# Patient Record
Sex: Female | Born: 1950 | Race: Black or African American | Hispanic: No | Marital: Married
Health system: Midwestern US, Academic
[De-identification: ages and names within clinical notes are randomized; demographics above are authoritative.]

---

## 2016-05-29 MED ORDER — LOSARTAN 100 MG PO TAB
100 mg | ORAL_TABLET | Freq: Every day | ORAL | 3 refills | 30.00000 days | Status: DC
Start: 2016-05-29 — End: 2016-08-30

## 2016-06-04 MED ORDER — SPIRONOLACTONE 25 MG PO TAB
ORAL_TABLET | Freq: Every day | ORAL | 3 refills | 90.00000 days | Status: DC
Start: 2016-06-04 — End: 2016-08-30

## 2016-06-29 MED ORDER — CARVEDILOL 25 MG PO TAB
ORAL_TABLET | Freq: Two times a day (BID) | ORAL | 3 refills | 90.00000 days | Status: DC
Start: 2016-06-29 — End: 2016-08-30

## 2016-07-06 MED ORDER — LOSARTAN 50 MG PO TAB
ORAL_TABLET | Freq: Every day | ORAL | 3 refills | 90.00000 days | Status: DC
Start: 2016-07-06 — End: 2016-08-30

## 2016-08-30 ENCOUNTER — Ambulatory Visit: Admit: 2016-08-30 | Discharge: 2016-08-31 | Payer: MEDICARE

## 2016-08-30 ENCOUNTER — Encounter: Admit: 2016-08-30 | Discharge: 2016-08-30 | Payer: MEDICARE

## 2016-08-30 DIAGNOSIS — E7800 Pure hypercholesterolemia, unspecified: Principal | ICD-10-CM

## 2016-08-30 DIAGNOSIS — Z95810 Presence of automatic (implantable) cardiac defibrillator: ICD-10-CM

## 2016-08-30 DIAGNOSIS — I10 Essential (primary) hypertension: ICD-10-CM

## 2016-08-30 DIAGNOSIS — I428 Other cardiomyopathies: ICD-10-CM

## 2016-08-30 MED ORDER — SPIRONOLACTONE 25 MG PO TAB
25 mg | ORAL_TABLET | Freq: Every day | ORAL | 3 refills | 90.00000 days | Status: AC
Start: 2016-08-30 — End: 2017-02-05

## 2016-08-30 MED ORDER — LOSARTAN 100 MG PO TAB
100 mg | ORAL_TABLET | Freq: Every day | ORAL | 3 refills | 30.00000 days | Status: AC
Start: 2016-08-30 — End: 2017-05-07

## 2016-08-30 MED ORDER — CARVEDILOL 25 MG PO TAB
37.5 mg | ORAL_TABLET | Freq: Two times a day (BID) | ORAL | 3 refills | 90.00000 days | Status: AC
Start: 2016-08-30 — End: 2017-05-07

## 2016-08-30 NOTE — Progress Notes
Patient in clinic for device check and informs nursing her med list is wrong. She is only taking losartan 100 mg daily.  Losartan 50 mg was removed from her med list and refills submitted per patient request.

## 2016-09-11 ENCOUNTER — Ambulatory Visit: Admit: 2016-09-11 | Discharge: 2016-09-12 | Payer: MEDICARE

## 2016-09-11 ENCOUNTER — Encounter: Admit: 2016-09-11 | Discharge: 2016-09-11 | Payer: MEDICARE

## 2016-09-11 DIAGNOSIS — I10 Essential (primary) hypertension: ICD-10-CM

## 2016-09-11 DIAGNOSIS — E785 Hyperlipidemia, unspecified: ICD-10-CM

## 2016-09-11 DIAGNOSIS — I472 Ventricular tachycardia: ICD-10-CM

## 2016-09-11 DIAGNOSIS — G500 Trigeminal neuralgia: ICD-10-CM

## 2016-09-11 DIAGNOSIS — Z95810 Presence of automatic (implantable) cardiac defibrillator: ICD-10-CM

## 2016-09-11 DIAGNOSIS — M609 Myositis, unspecified: ICD-10-CM

## 2016-09-11 DIAGNOSIS — I509 Heart failure, unspecified: ICD-10-CM

## 2016-09-11 DIAGNOSIS — E041 Nontoxic single thyroid nodule: ICD-10-CM

## 2016-09-11 DIAGNOSIS — I428 Other cardiomyopathies: ICD-10-CM

## 2016-09-11 NOTE — Progress Notes
Date of Service: 09/11/2016    Stephanie Morse is a 66 y.o. female.       HPI     Stephanie Morse presents for evaluation of her heart failure 6 months after I last saw her.  She has just spent 2 weeks hosting her 77 and 44 year old grandchildren who live in [place].  She played the role of high-energy grandmother getting them to world to find the zoo museums and she held up fairly well.  She is not had any new shortness of breath or swelling.  She feels fatigue it is looking forward to just resting a bit now but she got through the entire 2 weeks without having to cut back because of her health condition.  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  Her EF improved to around 40% with CRT-D and medical therapy.  She had an ICD shock in 2012 because of one-to-one conduction in a supraventricular tachycardia but does not have track record of appropriate shocks in in recent memory.    She is on good doses of spironolactone carvedilol and losartan.    When I saw her last I was concerned about her blood pressure and double her losartan from 50-100.  Her blood pressure today is much improved with readings of 104 and 108 in the right and left arms respectively.  She has had no hypotensive side effects on the higher dose losartan.    Her last echo was in October 2016 with EF 35% LV end-diastolic dimension upper range of normal at 5.5.  She has had no change in her clinical status since then and does not require repeat echo.    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  She takes low-dose Aleve or IBU trying to control her arthritis with the understanding that the less she takes the easier it is on her heart given the risks of renal insufficiency and hypertension with worsening heart failure due to NSAIDs.       Vitals:    09/11/16 1422 09/11/16 1433   BP: 108/70 104/68   Pulse:  81   Weight: 86.6 kg (191 lb) Height: 1.676 m (5' 6)      Body mass index is 30.83 kg/m???.     Past Medical History  Patient Active Problem List    Diagnosis Date Noted   ??? 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 02/26/05 CRT-D implant Dr. Bradly Bienenstock d/t Non sust'd VT w/ acute CHF . Fidelis; LV [id number] Lead, EF and QOL improve.  b. 02/13/08: VT treated w/ burst pacing x1, resolved w/ Rx, Pt had sx tachypalpitations  C 01/20/10  CRT-D Generator change, Fidelis  Lead removed,  LV lead repair at angulation  point near generator, DFT testing: VT terminates w 20 J shock but not 15.(LDB)  D. 12/24/14 CRT-D Generator change. Medtronic CRT-D VIVA XT CRTD [id number]     ??? Cough due to ACE inhibitor 11/30/2014     11/1114 reports dry cough after taking morning meds, Lisinopril 20 DC'd losartan 50 begun     ??? 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 Atrial tachycardia [facility]) 04/26/2010     Feb 2012 ICD shock in FVT zone for 1:1  conduction  03/2310 Mag slighly low at 1.7, 400 mg supplement begun but triggers nausea.      ??? VT (ventricular tachycardia) (HCC) 01/03/2010   ??? Thyroid nodule 10/27/2008     benign     ??? Trigeminal neuralgia 10/27/2008   ??? 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  11/06 DOE, orthop, PND onset w/ progression.   b.  02/20/05 admit [facility] acute CHF, non sust'd VT, LBBB BNP 931>>02/21/05 Transfer [facility], Echo EF 15%, LV 7.6, LA 4.9, Mod MR.   c.  1/07 [facility]: Normal coronaries, EF 20%, Coreg, ACE titration, 02/26/05 Implant Medtronic CRT-D.   d.  07/09/05 Echo EF 25-30% LV 6.2  e.  12/07 Echo EF 40%, LVEDD 5.2 PAP <30  f.   4/08 EF 45% LV 5.5  G. 2/10 Echo EF 45%, Valves OK mild MR  H. 08/18/09 Regaden Thall EF 51%, LV volume 110, Normal perfusion.   I   12/08/12-2D echo- decreased LV contractility from 04/06/2008 04/2013: Echo Doppler: LV EF 40% PAP= 43 Mild to mod Concentric LVH     ??? 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  Positive anti-nuclear antibody,RNP     ??? Hypertension 07/03/2008     a. 2004 Rx Altace, HCTZ Dr. Reather Littler     ??? Hyperlipemia 07/03/2008     07/23/11 total 129, trig 57, HDL 39 LDL 79 on simvastatin 20           Review of Systems   Constitution: Positive for malaise/fatigue.   HENT: Negative.    Eyes: Negative.    Cardiovascular: Positive for claudication.   Respiratory: Negative.    Endocrine: Negative.    Hematologic/Lymphatic: Negative.    Skin: Negative.    Musculoskeletal: Positive for arthritis, joint swelling and stiffness.   Gastrointestinal: Positive for heartburn.   Genitourinary: Negative.    Neurological: Positive for excessive daytime sleepiness.   Psychiatric/Behavioral: Negative.    Allergic/Immunologic: Negative.    14 organ system review noted. It is negative except as reported in current narrative or above in the ROS section. This is a patient centered review of systems that was stated by the patient in her terms prior to my personal problem oriented interview with the patient     Physical Exam  Appearance:Looks generally healthy and comfortable, no distress,  mucous membranes moist.   Skin: Skin warm and dry Eyes: Pupils equal and round Thyroid: not enlarged   Carotids: Normal upstrokes, no bruits Neck Veins: CVP less than 6 cm, no V wave, no HJ Reflux   Chest/thorax: Breathing comfortably. Lungs clear to percussion and auscultation. No rales, rhonchi or wheezing. Left delto-pectoral electronic pulse generator  Cardiac: Rhythm regular. PMI not palpable. S1 and S2 normal with fourth heart sound but no rub or third sound. No murmur   Abdominal Exam: Abdomen soft, non-tender, no masses. Normal bowel sounds. Liver not enlarged. No abdominal bruit, aorta not palpable Pulses: Femorals: Normal pulses, no bruits Pedals Normal PT pulses   Leg/ankle edema: None. Neuro/Motor: Normal muscle strength and control.   Neuro/Cognition: Good insight, clear historian, no depression     Cardiovascular Studies  Today's 12 lead EKG: P synchronous ventricular paced rhythm, sinus rate 81.     Her cholesterol profile last October looked quite good on low-dose simvastatin.  No reason to change.  Annual recheck later this fall is reasonable.  Ref. Range 11/21/2015    Cholesterol L 150 - 200  123 (L)   Triglycerides Unknown 53   HDL  35 - 60  33 (L)   LDL  81     Problems Addressed Today  Encounter Diagnoses   Name Primary?   ??? VT (ventricular tachycardia) (HCC) Yes   ??? Biventricular implantable cardioverter-defibrillator in situ    ??? Other cardiomyopathy (HCC)        Assessment and Plan     Mrs. Dian Situ is doing extremely well.  Her blood pressure control looks better on the higher dose losartan.  She has no fluid overload findings.  I am really not sure she has any symptoms of heart failure at this time since her activities of daily living are normal or higher than average for her age.  Medical program is on target.  There is no reason to consider moving her from losartan to [facility] given her lack of symptoms.  She knows to call if she develops any worsening symptoms of heart failure or fluid overload.  I think she can schedule her next visit in a year since were since she seems stable and not changed any meds today.  She had an excellent result with the losartan at 100 and does not need further augmentation of medical therapy for blood pressure.    NB: This document was prepared within the Epic(TM) EMR using templates developed by the [facility] of [facility]. It can be accessed online through [facility] feature by clinicians at other Epic institutions.  The free text in this document, was generated through Dragon(TM) software.  Editing and proofreading were done by the author of this document Dr. Mable Paris MD, [facility] principally at the point of care.  In spite of the author's best effort to identify every error introduced by voice to text dictation, some errors that may represent misspelling or misstatements of what was dictated may persist.  If there are questions about content in this document please contact Dr. Hale Bogus.    The written information I provided Ms. Hutmacher at the conclusion of today's encounter is as  follows:      Patient Instructions   He is referred for my dictation I think you are doing great I do not think you have any symptoms of heart failure her blood pressure is much better on 100 mg losartan that was on 50.  I am really thrilled to see you living an active life with no symptoms of heart failure.  Keep up the good work.  I was want people to be physically active 3 hours a week with some form of activity good whether or bad.  Having her 2 grandchildren at home accounts for the 3 hours but absent them he may need to add some walking.  I will see you again in a year he will need any tests at that time.  If you develop any problems with breathing or swelling before then please let me know and I will see you early.    I think would be prudent to check your BMP blood test for chemistry now that you are on the higher dose of losartan just to be certain that nothing changed for the worse.    It's good to see you today.  Marissa Nestle, MD              Current Medications (including today's revisions)  ??? carvedilol (COREG) 25 mg tablet Take 1.5 tablets by  mouth twice daily. Take with food.   ??? hydroxychloroquine (PLAQUENIL) 200 mg tablet Take 200 mg by mouth daily. 2  tabs daily   ??? losartan(+) (COZAAR) 100 mg tablet Take 1 tablet by mouth daily.   ??? simvastatin (ZOCOR) 20 mg PO tablet Take 20 mg by mouth at bedtime daily.   ??? spironolactone (ALDACTONE) 25 mg tablet Take 1 tablet by mouth daily. Take with food.

## 2016-09-15 ENCOUNTER — Encounter: Admit: 2016-09-15 | Discharge: 2016-09-15 | Payer: MEDICARE

## 2016-09-15 DIAGNOSIS — E041 Nontoxic single thyroid nodule: ICD-10-CM

## 2016-09-15 DIAGNOSIS — Z95810 Presence of automatic (implantable) cardiac defibrillator: ICD-10-CM

## 2016-09-15 DIAGNOSIS — I10 Essential (primary) hypertension: ICD-10-CM

## 2016-09-15 DIAGNOSIS — G500 Trigeminal neuralgia: ICD-10-CM

## 2016-09-15 DIAGNOSIS — I509 Heart failure, unspecified: ICD-10-CM

## 2016-09-15 DIAGNOSIS — I472 Ventricular tachycardia: ICD-10-CM

## 2016-09-15 DIAGNOSIS — M609 Myositis, unspecified: ICD-10-CM

## 2016-09-15 DIAGNOSIS — E785 Hyperlipidemia, unspecified: ICD-10-CM

## 2016-09-17 ENCOUNTER — Encounter: Admit: 2016-09-17 | Discharge: 2016-09-17 | Payer: MEDICARE

## 2016-09-17 DIAGNOSIS — Z95810 Presence of automatic (implantable) cardiac defibrillator: ICD-10-CM

## 2016-09-17 DIAGNOSIS — I428 Other cardiomyopathies: ICD-10-CM

## 2016-09-17 DIAGNOSIS — I472 Ventricular tachycardia: Principal | ICD-10-CM

## 2016-09-17 LAB — BASIC METABOLIC PANEL
Lab: 0.8
Lab: 107
Lab: 14
Lab: 4.2
Lab: 9.2
Lab: 112 — ABNORMAL HIGH (ref 98–107)
Lab: 142 10*3/uL (ref 0–0.20)
Lab: 22 — ABNORMAL LOW (ref 23–31)

## 2016-10-01 ENCOUNTER — Ambulatory Visit: Admit: 2016-10-01 | Discharge: 2016-10-02 | Payer: MEDICARE

## 2016-10-02 DIAGNOSIS — I472 Ventricular tachycardia: Principal | ICD-10-CM

## 2016-10-02 DIAGNOSIS — E7800 Pure hypercholesterolemia, unspecified: ICD-10-CM

## 2016-10-02 DIAGNOSIS — I428 Other cardiomyopathies: ICD-10-CM

## 2016-12-11 ENCOUNTER — Encounter: Admit: 2016-12-11 | Discharge: 2016-12-11 | Payer: MEDICARE

## 2016-12-11 NOTE — Telephone Encounter
Pt called to report that she is feeling fine and her BP was 93/65 on 12/10/16 at [facility]. Pt states she feels fine and she had an app on 10/22/187 with PCP who told her BP was great. Pt was advised to call if has symptoms.

## 2016-12-31 ENCOUNTER — Ambulatory Visit: Admit: 2016-12-31 | Discharge: 2017-01-01 | Payer: MEDICARE

## 2016-12-31 DIAGNOSIS — I472 Ventricular tachycardia: Principal | ICD-10-CM

## 2017-01-07 ENCOUNTER — Encounter: Admit: 2017-01-07 | Discharge: 2017-01-07 | Payer: MEDICARE

## 2017-01-07 DIAGNOSIS — I428 Other cardiomyopathies: ICD-10-CM

## 2017-01-07 DIAGNOSIS — Z95810 Presence of automatic (implantable) cardiac defibrillator: Principal | ICD-10-CM

## 2017-01-07 DIAGNOSIS — I472 Ventricular tachycardia: ICD-10-CM

## 2017-02-05 ENCOUNTER — Encounter: Admit: 2017-02-05 | Discharge: 2017-02-05 | Payer: MEDICARE

## 2017-02-05 MED ORDER — SPIRONOLACTONE 25 MG PO TAB
25 mg | ORAL_TABLET | Freq: Every day | ORAL | 1 refills | 90.00000 days | Status: AC
Start: 2017-02-05 — End: 2018-02-25

## 2017-04-01 ENCOUNTER — Ambulatory Visit: Admit: 2017-04-01 | Discharge: 2017-04-02 | Payer: MEDICARE

## 2017-04-02 DIAGNOSIS — I472 Ventricular tachycardia: Principal | ICD-10-CM

## 2017-05-07 ENCOUNTER — Encounter: Admit: 2017-05-07 | Discharge: 2017-05-07 | Payer: MEDICARE

## 2017-05-07 DIAGNOSIS — I429 Cardiomyopathy, unspecified: Principal | ICD-10-CM

## 2017-05-07 DIAGNOSIS — I10 Essential (primary) hypertension: ICD-10-CM

## 2017-05-07 DIAGNOSIS — E785 Hyperlipidemia, unspecified: ICD-10-CM

## 2017-05-07 MED ORDER — LOSARTAN 100 MG PO TAB
100 mg | ORAL_TABLET | Freq: Every day | ORAL | 3 refills | 30.00000 days | Status: AC
Start: 2017-05-07 — End: 2018-04-14

## 2017-05-07 MED ORDER — CARVEDILOL 25 MG PO TAB
37.5 mg | ORAL_TABLET | Freq: Two times a day (BID) | ORAL | 3 refills | 90.00000 days | Status: AC
Start: 2017-05-07 — End: 2018-04-14

## 2017-05-07 MED ORDER — SIMVASTATIN 20 MG PO TAB
20 mg | ORAL_TABLET | Freq: Every evening | ORAL | 0 refills | Status: AC
Start: 2017-05-07 — End: ?

## 2017-05-14 LAB — LIPID PROFILE
Lab: 107 mg/dL — ABNORMAL LOW (ref 150–200)
Lab: 3
Lab: 44 mg/dL (ref 8.5–10.6)
Lab: 61 mL/min (ref >60)

## 2017-05-14 LAB — BASIC METABOLIC PANEL
Lab: 141
Lab: 22 — ABNORMAL LOW (ref 23–31)

## 2017-05-14 LAB — BNP (B-TYPE NATRIURETIC PEPTI): Lab: <10

## 2017-05-23 ENCOUNTER — Encounter: Admit: 2017-05-23 | Discharge: 2017-05-23 | Payer: MEDICARE

## 2017-05-23 DIAGNOSIS — I10 Essential (primary) hypertension: ICD-10-CM

## 2017-05-23 DIAGNOSIS — I472 Ventricular tachycardia: ICD-10-CM

## 2017-05-23 DIAGNOSIS — Z95810 Presence of automatic (implantable) cardiac defibrillator: ICD-10-CM

## 2017-05-23 DIAGNOSIS — E785 Hyperlipidemia, unspecified: ICD-10-CM

## 2017-05-23 DIAGNOSIS — M609 Myositis, unspecified: ICD-10-CM

## 2017-05-23 DIAGNOSIS — E041 Nontoxic single thyroid nodule: ICD-10-CM

## 2017-05-23 DIAGNOSIS — I509 Heart failure, unspecified: ICD-10-CM

## 2017-05-23 DIAGNOSIS — G500 Trigeminal neuralgia: ICD-10-CM

## 2017-07-01 ENCOUNTER — Ambulatory Visit: Admit: 2017-07-01 | Discharge: 2017-07-02 | Payer: MEDICARE

## 2017-07-02 DIAGNOSIS — I472 Ventricular tachycardia: Principal | ICD-10-CM

## 2017-07-02 DIAGNOSIS — I428 Other cardiomyopathies: Secondary | ICD-10-CM

## 2017-08-13 ENCOUNTER — Encounter: Admit: 2017-08-13 | Discharge: 2017-08-13 | Payer: MEDICARE

## 2017-08-13 DIAGNOSIS — E785 Hyperlipidemia, unspecified: Principal | ICD-10-CM

## 2017-08-27 ENCOUNTER — Ambulatory Visit: Admit: 2017-08-27 | Discharge: 2017-08-28 | Payer: MEDICARE

## 2017-08-27 ENCOUNTER — Encounter: Admit: 2017-08-27 | Discharge: 2017-08-27 | Payer: MEDICARE

## 2017-08-27 DIAGNOSIS — E041 Nontoxic single thyroid nodule: ICD-10-CM

## 2017-08-27 DIAGNOSIS — Z95810 Presence of automatic (implantable) cardiac defibrillator: Principal | ICD-10-CM

## 2017-08-27 DIAGNOSIS — I10 Essential (primary) hypertension: ICD-10-CM

## 2017-08-27 DIAGNOSIS — I509 Heart failure, unspecified: ICD-10-CM

## 2017-08-27 DIAGNOSIS — M609 Myositis, unspecified: ICD-10-CM

## 2017-08-27 DIAGNOSIS — E785 Hyperlipidemia, unspecified: ICD-10-CM

## 2017-08-27 DIAGNOSIS — I420 Dilated cardiomyopathy: ICD-10-CM

## 2017-08-27 DIAGNOSIS — I472 Ventricular tachycardia: ICD-10-CM

## 2017-08-27 DIAGNOSIS — G500 Trigeminal neuralgia: ICD-10-CM

## 2017-09-30 ENCOUNTER — Ambulatory Visit: Admit: 2017-09-30 | Discharge: 2017-10-01 | Payer: MEDICARE

## 2017-10-01 DIAGNOSIS — I428 Other cardiomyopathies: ICD-10-CM

## 2017-10-01 DIAGNOSIS — I472 Ventricular tachycardia: Principal | ICD-10-CM

## 2017-12-30 ENCOUNTER — Ambulatory Visit: Admit: 2017-12-30 | Discharge: 2017-12-31 | Payer: MEDICARE

## 2017-12-31 DIAGNOSIS — I472 Ventricular tachycardia: Principal | ICD-10-CM

## 2018-02-25 ENCOUNTER — Encounter: Admit: 2018-02-25 | Discharge: 2018-02-25 | Payer: MEDICARE

## 2018-02-25 MED ORDER — SPIRONOLACTONE 25 MG PO TAB
25 mg | ORAL_TABLET | Freq: Every day | ORAL | 1 refills | 46.00000 days | Status: AC
Start: 2018-02-25 — End: 2018-02-27

## 2018-02-27 ENCOUNTER — Encounter: Admit: 2018-02-27 | Discharge: 2018-02-27 | Payer: MEDICARE

## 2018-02-27 MED ORDER — SPIRONOLACTONE 25 MG PO TAB
25 mg | ORAL_TABLET | Freq: Every day | ORAL | 1 refills | 46.00000 days | Status: AC
Start: 2018-02-27 — End: ?

## 2018-02-27 MED ORDER — SPIRONOLACTONE 25 MG PO TAB
25 mg | ORAL_TABLET | Freq: Every day | ORAL | 1 refills | 90.00000 days | Status: AC
Start: 2018-02-27 — End: 2018-08-13

## 2018-03-31 ENCOUNTER — Ambulatory Visit: Admit: 2018-03-31 | Discharge: 2018-04-01 | Payer: MEDICARE

## 2018-04-01 DIAGNOSIS — I472 Ventricular tachycardia: Principal | ICD-10-CM

## 2018-04-14 ENCOUNTER — Encounter: Admit: 2018-04-14 | Discharge: 2018-04-14 | Payer: MEDICARE

## 2018-04-14 MED ORDER — LOSARTAN 100 MG PO TAB
ORAL_TABLET | Freq: Every day | ORAL | 3 refills | 30.00000 days | Status: AC
Start: 2018-04-14 — End: 2018-09-25

## 2018-04-14 MED ORDER — CARVEDILOL 25 MG PO TAB
37.5 mg | ORAL_TABLET | Freq: Two times a day (BID) | ORAL | 3 refills | 90.00000 days | Status: AC
Start: 2018-04-14 — End: 2019-02-25

## 2018-06-30 ENCOUNTER — Encounter: Admit: 2018-06-30 | Discharge: 2018-06-30 | Payer: MEDICARE

## 2018-06-30 ENCOUNTER — Ambulatory Visit: Admit: 2018-06-30 | Discharge: 2018-06-30 | Payer: MEDICARE

## 2018-06-30 DIAGNOSIS — I428 Other cardiomyopathies: Principal | ICD-10-CM

## 2018-06-30 DIAGNOSIS — Z95810 Presence of automatic (implantable) cardiac defibrillator: ICD-10-CM

## 2018-06-30 DIAGNOSIS — I420 Dilated cardiomyopathy: Principal | ICD-10-CM

## 2018-06-30 DIAGNOSIS — I472 Ventricular tachycardia: ICD-10-CM

## 2018-06-30 DIAGNOSIS — I429 Cardiomyopathy, unspecified: ICD-10-CM

## 2018-08-13 ENCOUNTER — Encounter: Admit: 2018-08-13 | Discharge: 2018-08-13

## 2018-08-13 MED ORDER — SPIRONOLACTONE 25 MG PO TAB
25 mg | ORAL_TABLET | Freq: Every day | ORAL | 0 refills | 90.00000 days | Status: DC
Start: 2018-08-13 — End: 2018-09-25

## 2018-09-25 ENCOUNTER — Encounter: Admit: 2018-09-25 | Discharge: 2018-09-25

## 2018-09-25 NOTE — Progress Notes
Please send the following records for continuity of care:     Stephanie Morse  DOB  1951/01/06    Most recent lipid and liver profile, chemistry panel, and thyroid panel.     Please fax results to:     [facility] Cardiovascular Medicine Department     Dionicia Abler / [place] office fax: [phone]          Thank you,         Please call [phone] with any questions or concerns.

## 2018-09-25 NOTE — Telephone Encounter
09/25/2018 3:35 PM   I received a VM from [facility]. She saw her PCP Dr Shon Hough and he made some medication changes and drew some lab work.  I updated the med list and will send for labs. I called Kitrina back and let her know I had sent for her lab and updated her medication list.Laqueshia Cihlar Roslyn Smiling, Therapist, sports

## 2018-09-29 ENCOUNTER — Encounter: Admit: 2018-09-29 | Discharge: 2018-09-29

## 2018-09-29 ENCOUNTER — Ambulatory Visit: Admit: 2018-09-29 | Discharge: 2018-09-30

## 2018-09-29 DIAGNOSIS — I420 Dilated cardiomyopathy: Secondary | ICD-10-CM

## 2018-09-29 DIAGNOSIS — I472 Ventricular tachycardia: Secondary | ICD-10-CM

## 2018-09-29 DIAGNOSIS — Z95810 Presence of automatic (implantable) cardiac defibrillator: Secondary | ICD-10-CM

## 2018-09-29 DIAGNOSIS — I429 Cardiomyopathy, unspecified: Secondary | ICD-10-CM

## 2018-10-14 ENCOUNTER — Encounter: Admit: 2018-10-14 | Discharge: 2018-10-14

## 2018-10-14 ENCOUNTER — Ambulatory Visit: Admit: 2018-10-14 | Discharge: 2018-10-15

## 2018-10-14 DIAGNOSIS — G500 Trigeminal neuralgia: Secondary | ICD-10-CM

## 2018-10-14 DIAGNOSIS — E785 Hyperlipidemia, unspecified: Secondary | ICD-10-CM

## 2018-10-14 DIAGNOSIS — I420 Dilated cardiomyopathy: Secondary | ICD-10-CM

## 2018-10-14 DIAGNOSIS — M359 Systemic involvement of connective tissue, unspecified: Secondary | ICD-10-CM

## 2018-10-14 DIAGNOSIS — I10 Essential (primary) hypertension: Secondary | ICD-10-CM

## 2018-10-14 DIAGNOSIS — I472 Ventricular tachycardia: Secondary | ICD-10-CM

## 2018-10-14 DIAGNOSIS — M609 Myositis, unspecified: Secondary | ICD-10-CM

## 2018-10-14 DIAGNOSIS — E041 Nontoxic single thyroid nodule: Secondary | ICD-10-CM

## 2018-10-14 DIAGNOSIS — I428 Other cardiomyopathies: Secondary | ICD-10-CM

## 2018-10-14 DIAGNOSIS — E7800 Pure hypercholesterolemia, unspecified: Secondary | ICD-10-CM

## 2018-10-14 DIAGNOSIS — Z95810 Presence of automatic (implantable) cardiac defibrillator: Secondary | ICD-10-CM

## 2018-10-14 DIAGNOSIS — I509 Heart failure, unspecified: Secondary | ICD-10-CM

## 2018-10-14 DIAGNOSIS — R05 Cough: Secondary | ICD-10-CM

## 2018-10-14 MED ORDER — HYDROXYCHLOROQUINE 200 MG PO TAB
200 mg | Freq: Every day | ORAL | 0 refills | 90.00000 days | Status: AC
Start: 2018-10-14 — End: ?

## 2018-10-14 MED ORDER — CANDESARTAN 4 MG PO TAB
4 mg | ORAL_TABLET | Freq: Every day | ORAL | 3 refills | Status: DC
Start: 2018-10-14 — End: 2019-03-27

## 2018-10-14 NOTE — Progress Notes
Date of Service: 10/14/2018    Stephanie Morse is a 68 y.o. female.       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  He has had significant LV dysfunction and had EF 20% with initial presentation at [facility] and November 2007 after heart failure symptoms in [place] an acute hospitalization in December 2006.  She has had good response to medical therapy..  She has had minimal symptoms for years.  Her EF's have been around 40% on the last several echoes with the most recent echo in 2016.  She has had no changes in her clinical status that warranted another echo as she already has a defibrillator in and other no treatment decisions to be made based solely on the echo.    She is not having any symptoms of heart failure.  She has no fluid overload symptoms.  She is fully retired as is her husband and she is enjoying activities of daily living with 2 grandchildren living in the area the other is in [place].    She reports multiple awakenings for urination at night.  She is not on a diuretic.  Her only medications for her heart are carvedilol 37.5 twice daily amlodipine for hypertension and simvastatin for cholesterol control in the absence of any clinically relevant coronary atherosclerotic events.      She has been taking hydroxychloroquine 400 mg daily for years with indication being mixed connective tissue disease.  With the recent controversies about the presidential an ointment of hydroxychloroquine as a treatment for COVID-19 I asked her about her therapy with hydroxychloroquine.  She has had some delays in shipping from [facility] mail order but no sustained periods of time without therapy.  She does not have a lot of arthritic the right now and is uncertain about needing to continue the 400 mg daily dose she has been on for years.  She has lost contact with the rheumatologist that prescribed the hydroxychloroquine initially.         Vitals: 10/14/18 1409 10/14/18 1417   BP: 116/72 118/74   BP Source: Arm, Left Upper Arm, Right Upper   Pulse: 87    Temp: 36.6 ???C (97.9 ???F)    SpO2: 97%    Weight: 79.8 kg (176 lb)    Height: 1.676 m (5' 6)    PainSc: Zero      Body mass index is 28.41 kg/m???.     Past Medical History  Patient Active Problem List    Diagnosis Date Noted   ??? Cough due to ACE inhibitor 11/30/2014     11/1114 reports dry cough after taking morning meds, Lisinopril 20 DC'd losartan 50 begun     ??? 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 Atrial tachycardia [facility]) 04/26/2010     Feb 2012 ICD shock in FVT zone for 1:1 conduction  03/2310 Mag slighly low at 1.7, 400 mg supplement begun but triggers nausea.      ??? VT (ventricular tachycardia) (HCC) 01/03/2010   ??? Thyroid nodule 10/27/2008     benign     ??? Trigeminal neuralgia 10/27/2008   ??? 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  11/06 DOE, orthop, PND onset w/ progression.   b.  02/20/05 admit [facility] acute CHF, non sust'd  VT, LBBB BNP 931>>02/21/05 Transfer [facility], Echo EF 15%, LV 7.6, LA 4.9, Mod MR.   c.  1/07 Cath [place]: Normal coronaries, EF 20%, Coreg, ACE titration, 02/26/05 Implant Medtronic CRT-D.   d.  07/09/05 Echo EF 25-30% LV 6.2  e.  12/07 Echo EF 40%, LVEDD 5.2 PAP <30  f.   4/08 EF 45% LV 5.5  G. 2/10 Echo EF 45%, Valves OK mild MR  H. 08/18/09 Regaden Thall EF 51%, LV volume 110, Normal perfusion.   I   12/08/12-2D echo- decreased LV contractility from 04/06/2008  04/2013: Echo Doppler: LV EF 40% PAP= 43 Mild to mod Concentric LVH     ??? 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  Positive anti-nuclear antibody,RNP     ??? Hypertension 07/03/2008     a. 2004 Rx Altace, HCTZ Dr. Reather Littler     ??? 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 02/26/05 CRT-D implant Dr. Bradly Bienenstock d/t Non sust'd VT w/ acute CHF . Fidelis; LV [id number] Lead, EF and QOL improve.  b. 02/13/08: VT treated w/ burst pacing x1, resolved w/ Rx, Pt had sx tachypalpitations  C  01/20/10  CRT-D Generator change, Fidelis  Lead removed,  LV lead repair at angulation  point near generator, DFT testing: VT terminates w 20 J shock but not 15.(LDB)  D. 12/24/14 CRT-D Generator change. Medtronic CRT-D VIVA XT CRTD [id number]     ??? Hyperlipemia 07/03/2008     07/23/11 total 129, trig 57, HDL 39 LDL 79 on simvastatin 20           Review of Systems   Constitution: Negative.   HENT: Negative.    Eyes: Negative.    Cardiovascular: Negative.    Respiratory: Negative.    Endocrine: Negative.    Hematologic/Lymphatic: Negative.    Skin: Negative.    Musculoskeletal: Positive for arthritis and joint pain.   Gastrointestinal: Negative.    Genitourinary: Positive for frequency.   Neurological: Negative.    Psychiatric/Behavioral: Negative.    Allergic/Immunologic: Negative.    14 organ system review noted. It is negative except as reported in current narrative or above in the ROS section. This is a patient centered review of systems that was stated by the patient in her terms prior to my personal problem oriented interview with the patient      Physical Exam  Appearance:Looks generally healthy and comfortable, no distress,  mucous membranes moist.   Skin: Skin warm and dry Eyes: Pupils equal and round Thyroid: not enlarged   Carotids: Normal upstrokes, no bruits Neck Veins: CVP less than 6 cm, no V wave, no HJ Reflux   Chest/thorax: Breathing comfortably. Lungs clear to percussion and auscultation. No rales, rhonchi or wheezing. Left delto-pectoral electronic pulse generator  Cardiac: Rhythm regular. PMI not palpable. S1 and S2 normal with fourth heart sound but no rub or third sound. No murmur   Abdominal Exam: Abdomen soft, non-tender, no masses. Normal bowel sounds. Liver not enlarged. No abdominal bruit, aorta not palpable   Pulses: Femorals: Normal pulses, no bruits Pedals Normal PT pulses   Leg/ankle edema: None.  She has bilateral scars from cutdowns the medial aspect of her lower legs from when she had severe hemorrhage in 1973 postpartum and was at risk of fatal hemorrhage requiring venous access.  I had not previously noted these cut down veins on prior exams.  Neuro/Motor: Normal muscle strength  and control.   Neuro/Cognition: Good insight, clear historian, no depression     Cardiovascular Studies    EKG today shows 100% P synchronous pacing with sinus rhythm rate 82.  ICD interrogation shows 99% ventricular pacing no A. fib or flutter 110 beat nonsustained VT in April.  LV output was increased a bit from 2.25 to 2.5 V at 10ms with no diaphragm stimulation and optimal thresholds.  Problems Addressed Today  No diagnosis found.    Assessment and Plan     Neha is doing very well from heart failure with reduced ejection fraction standpoint.  She is 100% by V paced.  She has no clear symptoms of heart failure that I can identify.  She has had symptoms in the past so she is functional class I stage C heart failure.  Medical program it is a little suboptimal as she is not currently on renin-angiotensin system inhibition.  She tells me that she came off her losartan because of a rising creatinine.  I can see from our lab that her creatinine had been over 2 and is now about 1.6 was recently.  I think this would be very reasonable time to resume therapy.  She is at ACE cough.  I am starting her on candesartan 4 mg which is 1/8 the top dose and is the smallest dosage form offered.  This is preferable to starting losartan where top dose is generally 100 with lowest tablet strength being 25 which is one fourth the top dose.  She is going to see Dr. Alona Bene her family physician in about a week.  She will get the candesartan started by picking it up at [facility] today or tomorrow so she can have a BMP today and recheck her renal function when she sees Dr. Alona Bene or he can wait another week.  I think this low-dose ARB is unlikely to cause renal problems and is those are mostly with people with bilateral renal artery stenosis but we at least can have follow-up in the near term to see how renal function is trending.    Her volume status looks good.  She has no ankle edema.  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  It turns out that she had a brush with death at the birth of her first of 3 children with severe bleeding and she recalls that cutdowns on her veins in her legs were done in order to give her emergency transfusions when other veins could not be identified.  This does not have any real bearing on her current management other than for me to have noticed for the first time ever when I seen her that she has venous cutdown scars both ankles.    Her nocturnal polyuria might be related to since problems with bladder suspension after childbirth.  If she wants to pursue this further she could discuss it with Dr. Alona Bene who can remake make a referral to [place] where they have excellent GYN your all urologic surgeons familiar with these situations.    I told her to drop the dose of chloroquine hydroxychloroquine to 200 mg daily which from a brief review on the Internet in the office is maintenance dose.  I am not sure she has no symptoms to notice any flareup if she drops the dose to 200 but again something else discussed with Dr. Alona Bene would be discontinuation of the Plaquenil completely if she does not notice any flareup at 200  mg daily.  This is not disease modification and symptom management so lack of apparent clinical benefit is an indication to stop the drug.      NB: The free text in this document was generated through Dragon(TM) software with editing and proofreading  done by the author of this document Dr. Mable Paris MD, [facility] principally at the point of care. Some errors may persist.  If there are questions about content in this document please contact Dr. Hale Bogus.    The written information I provided Ms. Gravette at the conclusion of today's encounter is as  follows:    Patient Instructions   For your heart failure the best thing for you to do right now is to start the lowest possible dose of candesartan.  This is a drug like losartan but the dose is lower at 4 mg and is unlikely to cause problems with kidney disease.  Want to check your BMP blood test today.  I want you to have another blood test just like it the day you going to see Dr. Alona Bene.  I Ernie Hew order the test but do not have it done because he might want to get other blood tests when he sees you and I do not want you to have to get 2 sticks.    Just plan to stay on the candesartan indefinitely.  This this is a drug that can provide benefit for you even if you are not noticing it makes you feel much that much better.  You are not having problems with heart failure so is hard to feel better when you are not have any problems.  I just want to get you on medicines that will maintain your heart health as well as possible.    Based on my quick search on the Internet about Plaquenil I think it is a good idea for you to cut this back to200 mg dose from 400 mg.  You can talk to Dr. Alona Bene about further dose reduction as well or get a recommendation for a new rheumatologist.    You are not taking diuretic as the cause of your nighttime urination.  This may be something related to bladder malpositioning which stretching of the ligaments after childbirth and beyond.  Again this is something that requires specialty expertise at surgical to be a repair.  If not having that much trouble it may not be that necessary.  On the other hand if there is any question about renal dysfunction you may want to get an ultrasound of your abdomen to make sure that you do not have some element of bladder outlet obstruction that is leading to fluid buildup of urine in your kidneys that is causing part of the dysfunction I think at this point it would be a good idea for you to get a renal ultrasound to look at your bladder and ureters and pelvis kidneys to make sure that there is no structural abnormality contributing to the abnormal kidney function.  You can get this done and Dr. Alona Bene can discuss the results with you    So plans for today:   1. start candesartan 4 mg daily  2. have renal bladder and abdomen and bladder ultrasound at [facility] to make sure there is no kidney damage caused by mechanical obstruction or other abnormalities you cannot tell without ultrasound  3.  Blood test today BMP.  We will put in an order for another BMP the day you see Dr. Alona Bene.  Do not  have this done until you see him so that if he wants other blood tests you will not need a second blood draw.    You can see me again in 6, 8 or 12 months depending on how things are going.  I can see you sooner if needed but I do not think it is going to be needed.    It's good to see you today.   Call in if you have problems or questions.   Marissa Nestle, MD       s         Current Medications (including today's revisions)  ??? amLODIPine (NORVASC) 10 mg tablet Take 10 mg by mouth daily.   ??? carvediloL (COREG) 25 mg tablet Take 1.5 tablets by mouth twice daily with meals.   ??? hydroxychloroquine (PLAQUENIL) 200 mg tablet Take 200 mg by mouth daily. 2  tabs daily   ??? meloxicam (MOBIC) 7.5 mg tablet Take 7.5 mg by mouth daily.   ??? simvastatin (ZOCOR) 20 mg tablet Take one tablet by mouth at bedtime daily.

## 2018-10-15 ENCOUNTER — Encounter: Admit: 2018-10-15 | Discharge: 2018-10-15

## 2018-10-15 DIAGNOSIS — I420 Dilated cardiomyopathy: Secondary | ICD-10-CM

## 2018-10-15 LAB — BASIC METABOLIC PANEL
Lab: 11
Lab: 120 — ABNORMAL HIGH (ref 70–105)
Lab: 140
Lab: 9.3
Lab: 1.2 — ABNORMAL HIGH (ref 0.57–1.11)
Lab: 110 — ABNORMAL HIGH (ref 98–107)
Lab: 17
Lab: 23
Lab: 3.8

## 2018-10-16 ENCOUNTER — Encounter: Admit: 2018-10-16 | Discharge: 2018-10-16

## 2018-10-16 NOTE — Progress Notes
2. have renal bladder and abdomen and bladder ultrasound at [facility] to make sure there is no kidney damage caused by mechanical obstruction or other abnormalities you cannot tell without ultrasound    per cbp recent ov w/ patient. inbasket sent to [facility] nurses to have schedule at [facility]

## 2018-10-17 ENCOUNTER — Encounter: Admit: 2018-10-17 | Discharge: 2018-10-17

## 2018-10-17 NOTE — Progress Notes
Called pt to see if she wanted to schedule renal, bladder and abdomen ultrasound.  Pt states she has an appt with her PCP on Tuesday and plans on having PCP order.  I asked her to call us back if he doesn't order and we would.  Pt verbalizes understanding. No further needs at this time.

## 2018-12-30 ENCOUNTER — Encounter: Admit: 2018-12-30 | Discharge: 2018-12-30 | Payer: MEDICARE

## 2018-12-30 NOTE — Telephone Encounter
Called pt to discuss symptoms.  She states that she noticed some swelling in her lower extremities that goes away after she rests and raises up her lower extremities.  She hasn't been checking her weight lately, but she will start checking her weight.  She denies any shortness of breath.  She states that she is overall feeling okay.  She will monitor her weight and call us if she notices any increases or any symptoms.    Will route to Dr. Starleen Blue for any additional recommendations.

## 2018-12-30 NOTE — Telephone Encounter
-----   Message from Blanchie Serve, RN sent at 12/30/2018  1:02 PM CST -----  Regarding: FW: Pt of Dr Starleen Blue    ----- Message -----  From: Coralee North, RN  Sent: 12/30/2018  10:53 AM CST  To: Blanchie Serve, RN  Subject: FW: Pt of Dr Starleen Blue                                ----- Message -----  From: Elyse Hsu  Sent: 12/30/2018  10:20 AM CST  To: [facility] Nurse Gen Card Team Gold  Subject: Pt of Dr Starleen Blue                                  Heart failure diagnostics assessed through the device  Status: Elevated suggestive of fluid accumulation improved from  recent trend but still at an index of 80 - suggestive of fluid accumulation     Please follow-up as needed

## 2019-02-25 MED ORDER — CARVEDILOL 25 MG PO TAB
ORAL_TABLET | Freq: Two times a day (BID) | ORAL | 3 refills | 90.00000 days | Status: AC
Start: 2019-02-25 — End: ?

## 2019-03-27 ENCOUNTER — Encounter: Admit: 2019-03-27 | Discharge: 2019-03-27 | Payer: MEDICARE

## 2019-03-27 MED ORDER — CANDESARTAN 4 MG PO TAB
4 mg | ORAL_TABLET | Freq: Every day | ORAL | 3 refills | Status: DC
Start: 2019-03-27 — End: 2019-04-01

## 2019-03-30 ENCOUNTER — Ambulatory Visit: Admit: 2019-03-30 | Discharge: 2019-03-30 | Payer: MEDICARE

## 2019-03-30 DIAGNOSIS — I420 Dilated cardiomyopathy: Secondary | ICD-10-CM

## 2019-03-30 DIAGNOSIS — Z95810 Presence of automatic (implantable) cardiac defibrillator: Secondary | ICD-10-CM

## 2019-03-30 DIAGNOSIS — I429 Cardiomyopathy, unspecified: Secondary | ICD-10-CM

## 2019-04-01 ENCOUNTER — Encounter: Admit: 2019-04-01 | Discharge: 2019-04-01 | Payer: MEDICARE

## 2019-04-01 MED ORDER — CANDESARTAN 4 MG PO TAB
4 mg | ORAL_TABLET | Freq: Every day | ORAL | 3 refills | Status: AC
Start: 2019-04-01 — End: ?

## 2019-05-08 ENCOUNTER — Encounter: Admit: 2019-05-08 | Discharge: 2019-05-08 | Payer: MEDICARE

## 2019-05-12 ENCOUNTER — Encounter: Admit: 2019-05-12 | Discharge: 2019-05-12 | Payer: MEDICARE

## 2019-05-12 ENCOUNTER — Ambulatory Visit: Admit: 2019-05-12 | Discharge: 2019-05-12 | Payer: MEDICARE

## 2019-05-12 DIAGNOSIS — I472 Ventricular tachycardia: Secondary | ICD-10-CM

## 2019-05-12 DIAGNOSIS — M609 Myositis, unspecified: Secondary | ICD-10-CM

## 2019-05-12 DIAGNOSIS — E785 Hyperlipidemia, unspecified: Secondary | ICD-10-CM

## 2019-05-12 DIAGNOSIS — I471 Supraventricular tachycardia: Secondary | ICD-10-CM

## 2019-05-12 DIAGNOSIS — E041 Nontoxic single thyroid nodule: Secondary | ICD-10-CM

## 2019-05-12 DIAGNOSIS — I509 Heart failure, unspecified: Secondary | ICD-10-CM

## 2019-05-12 DIAGNOSIS — I428 Other cardiomyopathies: Secondary | ICD-10-CM

## 2019-05-12 DIAGNOSIS — Z95810 Presence of automatic (implantable) cardiac defibrillator: Secondary | ICD-10-CM

## 2019-05-12 DIAGNOSIS — I10 Essential (primary) hypertension: Secondary | ICD-10-CM

## 2019-05-12 DIAGNOSIS — G500 Trigeminal neuralgia: Secondary | ICD-10-CM

## 2019-05-12 MED ORDER — AMLODIPINE 10 MG PO TAB
10 mg | ORAL_TABLET | Freq: Every day | ORAL | 3 refills | Status: AC
Start: 2019-05-12 — End: ?

## 2019-05-12 NOTE — Progress Notes
Date of Service: 05/12/2019    Stephanie Morse is a 69 y.o. female.       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  She has responded very well to medications and has not had any suggestion of decompensation in years.  She is 100% BiV paced.  She tolerates a slightly higher dose of carvedilol than standard and has had some issues with renal insufficiency but as noted below her renal function has been quite stable and slowly improving while remaining on candesartan 4 mg daily.  With creatinine as high as 2.8 last year I am reluctant to use Aldactone for fear of re-inducing hyperkalemia.  In August 2020 her potassium was up to 5.8.     Ref. Range 09/17/2016 00:00 05/14/2017 00:00 09/22/2018 00:00 10/01/2018 00:00 10/15/2018 00:00 03/23/2019 00:00   Creatinine Latest Ref Range: 0.57 - 1.11  0.8 1.24 (H) 2.84 (H) 1.66 (H) 1.29 (H) 1.23 (H)     As noted below her hemoglobin looks good and her lipids are also very favorable with LDL 59 HDL 41 and triglycerides 48 on February 1.   03/23/2019 00:00   Hemoglobin 12.2   Hematocrit 38.1   Platelet Count 145   White Blood Cells 4.5 (L)   RBC 3.79 (L)   MCV 100.0 (H)   MCH 32.1 (H)   MCHC 32.0   RDW 12.2   Sodium 142   Potassium 3.7   Chloride 111 (H)   CO2 23.0   Blood Urea Nitrogen 20.0   Creatinine 1.23 (H)   Glucose 99   Calcium 9.2   Cholesterol 110   Triglycerides 48   HDL 41   LDL 59     She is a vigorous in activities of daily living.  With the Covid quarantine stay home issues she is less active than she has been but that she gets out to see her 15 and 62 year old grandchildren's school events and navigates those without problems.  She is able to play with the kids tossing balls etc. keeping up as long as there is no high speed running.  Her knees bother her particularly when weather is bad but she has full range of motion and does not have constant daily knee pain limiting her activities.  She mentioned concerns about whether a knee replacement bite might be warranted but to me it does not sound as if she is anywhere close to having the limitations required for knee surgery.    She denies having typical symptoms suggesting the presence of angina, heart failure, TIA, claudication or arrhythmias.  She does not awaken from sleep with dyspnea.  She is uses 2 pillows for comfort but not to maintain a normal breathing.    She is not had any other major health setbacks since have seen her last.  Her blood pressures on her heart failure meds are similar to what we see today under 110 and above 100.         Vitals:    05/12/19 1339 05/12/19 1340   BP: 108/72 104/72   BP Source: Arm, Left Upper Arm, Right Upper   Patient Position: Sitting Sitting   Pulse: 94    SpO2: 96%    Weight: 82.9 kg (182 lb 12.8 oz)    Height: 1.676 m (5' 6)    PainSc: Zero      Body mass index is 29.5 kg/m?Marland Kitchen     Past Medical  History  Patient Active Problem List    Diagnosis Date Noted   ? Cough due to ACE inhibitor 11/30/2014     11/1114 reports dry cough after taking morning meds, Lisinopril 20 DC'd losartan 50 begun     ? 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 Atrial tachycardia [facility]) 04/26/2010     Feb 2012 ICD shock in FVT zone for 1:1 conduction  03/2310 Mag slighly low at 1.7, 400 mg supplement begun but triggers nausea.      ? VT (ventricular tachycardia) (HCC) 01/03/2010   ? Thyroid nodule 10/27/2008     benign     ? Trigeminal neuralgia 10/27/2008   ? 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  11/06 DOE, orthop, PND onset w/ progression.   b.  02/20/05 admit [facility] acute CHF, non sust'd VT, LBBB BNP 931>>02/21/05 Transfer [facility], Echo EF 15%, LV 7.6, LA 4.9, Mod MR.   c.  1/07 [facility]: Normal coronaries, EF 20%, Coreg, ACE titration, 02/26/05 Implant Medtronic CRT-D.   d.  07/09/05 Echo EF 25-30% LV 6.2  e.  12/07 Echo EF 40%, LVEDD 5.2 PAP <30  f.   4/08 EF 45% LV 5.5  G. 2/10 Echo EF 45%, Valves OK mild MR  H. 08/18/09 Regaden Thall EF 51%, LV volume 110, Normal perfusion.   I   12/08/12-2D echo- decreased LV contractility from 04/06/2008  04/2013: Echo Doppler: LV EF 40% PAP= 43 Mild to mod Concentric LVH     ? 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  Positive anti-nuclear antibody,RNP     ? Hypertension 07/03/2008     a. 2004 Rx Altace, HCTZ Dr. Reather Littler     ? 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 02/26/05 CRT-D implant Dr. Bradly Bienenstock d/t Non sust'd VT w/ acute CHF . Fidelis; LV [id number] Lead, EF and QOL improve.  b. 02/13/08: VT treated w/ burst pacing x1, resolved w/ Rx, Pt had sx tachypalpitations  C  01/20/10  CRT-D Generator change, Fidelis  Lead removed,  LV lead repair at angulation  point near generator, DFT testing: VT terminates w 20 J shock but not 15.(LDB)  D. 12/24/14 CRT-D Generator change. Medtronic CRT-D VIVA XT CRTD DTBA1D1     ? Hyperlipemia 07/03/2008     07/23/11 total 129, trig 57, HDL 39 LDL 79 on simvastatin 20           Review of Systems   Constitution: Negative.   HENT: Negative.    Eyes: Negative.    Cardiovascular: Negative.    Respiratory: Negative.    Endocrine: Negative.    Hematologic/Lymphatic: Negative.    Skin: Negative.    Musculoskeletal: Positive for arthritis.   Gastrointestinal: Negative.    Genitourinary: Negative.    Neurological: Negative.    Psychiatric/Behavioral: Negative.    Allergic/Immunologic: Negative.        Physical Exam  Appearance:Looks generally healthy and comfortable, no distress,  mucous membranes moist. Wearing a cloth face mask  Skin: Skin warm and dry Eyes: Pupils equal and round Thyroid: not enlarged   Carotids: Normal upstrokes, no bruits Neck Veins: CVP less than 6 cm, no V wave, no HJ Reflux   Chest/thorax: Breathing comfortably. Lungs clear  to percussion and auscultation. No rales, rhonchi or wheezing. Left delto-pectoral electronic pulse generator  Cardiac: Rhythm regular. PMI not palpable. S1 and S2 normal with fourth heart sound but no rub or third sound. No murmur   Abdominal Exam: Abdomen soft, non-tender, no masses. Normal bowel sounds. Liver not enlarged. No abdominal bruit, aorta not palpable   Pulses: Femorals: Normal pulses, no bruits Pedals Normal PT pulses   Leg/ankle edema: None.    Neuro/Motor: Normal muscle strength and control.   Neuro/Cognition: Good insight, clear historian, no depression     Cardiovascular Studies  EKG shows P synchronous ventricular pacing sinus rate 75 no ectopy    Problems Addressed Today  No diagnosis found.    Assessment and Plan     Stephanie Morse is doing very well.  Since I saw her in August when her creatinine and potassium were elevated she is not had any further major setbacks.  Her current candesartan at 4 mg is well-tolerated.  Her blood pressure readings are satisfactory on vigorous dose carvedilol.  I am still not comfortable resuming the Aldactone since I do not think it is going to be doing her that much good when she is minimally symptomatic but remains at risk for hyperkalemia.  She has not had an echo since 2016 but with minimal symptoms and ICD backup from her BiV pacer there are no treatment decisions I would make based on her ejection fraction in the absence of symptoms or fluid overload.  I think it would be better for her to stay on candesartan and try to switch to [facility] in the near term.   Other issues I discussed with her regarding renal function ICD interrogation and projected life span of the battery follow-up will after I conclude my practice in [place] in December and issues related to [facility] are discussed thoroughly below with the patient instructions I provided her.    The total time I expended today for this encounter for reviewing records,  interviewing patient, doing exam, developing diagnosis, creating treatment plan written in patient oriented  terminology  for the AVS, explaining it to the patient and entering further information in the EMR was 35 min    NB: The free text in this document was generated through Dragon(TM) software with editing and proofreading  done by the author of this document Dr. Mable Paris MD, [facility] principally at the point of care. Some errors may persist.  If there are questions about content in this document please contact Dr. Hale Bogus.    The written information I provided Stephanie Morse at the conclusion of today's encounter is as  follows:    Patient Instructions   I was concerned about your kidney function last time I saw you and am happy to see now that you are creatinine which is a number that you like to have under 1.0 came in at 1.23 February 1.  It was 2.84 September 22, 2018 so you are much better.  The last time it was less than 1 was nearly 3 years ago in July 2018.  1.23 is a little elevated but if it stays there,  no problems.  I have you on a low-dose of losartan of candesartan thinking at that dose would not hurt your kidney and possibly would help.  It looks like on that low dose things are getting better and not worse so stay with it.    The interrogation of March 30, 2019 said 1year and 9months left on the pacemaker  before needing to replace the generator.  That would mean November 2022 replacement time but it could be several months sooner or several months later.  Pretty sure that by 2024 you will have a new generator and you will need 1 before the end of this year.  I think Dr. Marya Amsler is a woman cardiologist who put your pacer in.  She is not retiring anytime soon so by the time you need a new device you can schedule a visit with her to get it done.    My recommendation for you on a 1 year follow-up is to see Dr. Erskine Squibb [place] who is daughter of an excellent internal medicine physician from [facility] and has dedicated training and certification in heart failure.  She is a terrific person and I think you will be happy to have her as your doctor.  I think by the time you see her your kidney function and everything may be stable enough that she will talk to you again about Entresto but at this point I think we should leave well enough alone and not add anything.  You are not really having any symptoms the new drugs are all expensive and I am not sure that they will make any difference when you are doing as well as you are    Put you on the schedule for 1 year from now and plan for you to see Dr. Pierre Bali.  I am coming up once a month on the fourth to the Tuesday until December.  Dr. Pierre Bali is already coming up here but I am not sure her schedule.  If you want to be seen sooner and meet her for an introductory visit that would be fine as well    It looks like the first time I met you was April 02, 2005 when Dr. Einar Pheasant directed you to see me.  I think we discovered you had a heart failure in January 2002It has been a pleasure to be your cardiologist.   Mable Paris, MD               Current Medications (including today's revisions)  ? amLODIPine (NORVASC) 10 mg tablet Take 10 mg by mouth daily.   ? candesartan (ATACAND) 4 mg tablet Take one tablet by mouth daily.   ? carvediloL (COREG) 25 mg tablet TAKE 1 AND 1/2 TABLETS TWICE DAILY WITH MEALS   ? hydrOXYchloroQUINE (PLAQUENIL) 200 mg tablet Take one tablet by mouth daily.   ? simvastatin (ZOCOR) 20 mg tablet Take one tablet by mouth at bedtime daily.

## 2019-05-21 ENCOUNTER — Encounter: Admit: 2019-05-21 | Discharge: 2019-05-21 | Payer: MEDICARE

## 2019-06-29 ENCOUNTER — Encounter: Admit: 2019-06-29 | Discharge: 2019-06-29 | Payer: MEDICARE

## 2019-09-30 ENCOUNTER — Encounter: Admit: 2019-09-30 | Discharge: 2019-09-30 | Payer: MEDICARE

## 2019-09-30 DIAGNOSIS — I472 Ventricular tachycardia: Secondary | ICD-10-CM

## 2019-09-30 DIAGNOSIS — Z95810 Presence of automatic (implantable) cardiac defibrillator: Secondary | ICD-10-CM

## 2019-09-30 DIAGNOSIS — I10 Essential (primary) hypertension: Secondary | ICD-10-CM

## 2019-10-13 ENCOUNTER — Encounter: Admit: 2019-10-13 | Discharge: 2019-10-13 | Payer: MEDICARE

## 2019-10-13 ENCOUNTER — Ambulatory Visit: Admit: 2019-10-13 | Discharge: 2019-10-13 | Payer: MEDICARE

## 2019-10-13 DIAGNOSIS — I472 Ventricular tachycardia: Secondary | ICD-10-CM

## 2019-10-13 DIAGNOSIS — Z95810 Presence of automatic (implantable) cardiac defibrillator: Secondary | ICD-10-CM

## 2019-10-13 DIAGNOSIS — I10 Essential (primary) hypertension: Secondary | ICD-10-CM

## 2019-12-28 ENCOUNTER — Encounter: Admit: 2019-12-28 | Discharge: 2019-12-28 | Payer: MEDICARE

## 2020-01-27 ENCOUNTER — Encounter: Admit: 2020-01-27 | Discharge: 2020-01-27 | Payer: MEDICARE

## 2020-03-14 ENCOUNTER — Encounter: Admit: 2020-03-14 | Discharge: 2020-03-14 | Payer: MEDICARE

## 2020-03-14 MED ORDER — CARVEDILOL 25 MG PO TAB
ORAL_TABLET | Freq: Two times a day (BID) | ORAL | 3 refills | 90.00000 days | Status: AC
Start: 2020-03-14 — End: ?

## 2020-03-14 NOTE — Telephone Encounter
Received a request via computer from the patients pharmacy requesting a refill.  Script e-scribed as requested.

## 2020-03-21 ENCOUNTER — Encounter: Admit: 2020-03-21 | Discharge: 2020-03-21 | Payer: MEDICARE

## 2020-03-21 MED ORDER — AMLODIPINE 10 MG PO TAB
ORAL_TABLET | Freq: Every day | 3 refills
Start: 2020-03-21 — End: ?

## 2020-03-29 ENCOUNTER — Encounter: Admit: 2020-03-29 | Discharge: 2020-03-29 | Payer: MEDICARE

## 2020-04-21 ENCOUNTER — Encounter: Admit: 2020-04-21 | Discharge: 2020-04-21 | Payer: MEDICARE

## 2020-04-21 DIAGNOSIS — G500 Trigeminal neuralgia: Secondary | ICD-10-CM

## 2020-04-21 DIAGNOSIS — Z95810 Presence of automatic (implantable) cardiac defibrillator: Secondary | ICD-10-CM

## 2020-04-21 DIAGNOSIS — E7800 Pure hypercholesterolemia, unspecified: Secondary | ICD-10-CM

## 2020-04-21 DIAGNOSIS — I10 Essential (primary) hypertension: Secondary | ICD-10-CM

## 2020-04-21 DIAGNOSIS — I472 Ventricular tachycardia: Secondary | ICD-10-CM

## 2020-04-21 DIAGNOSIS — M609 Myositis, unspecified: Secondary | ICD-10-CM

## 2020-04-21 DIAGNOSIS — I428 Other cardiomyopathies: Secondary | ICD-10-CM

## 2020-04-21 DIAGNOSIS — E785 Hyperlipidemia, unspecified: Secondary | ICD-10-CM

## 2020-04-21 DIAGNOSIS — R058 Cough due to ACE inhibitor: Secondary | ICD-10-CM

## 2020-04-21 DIAGNOSIS — M359 Systemic involvement of connective tissue, unspecified: Secondary | ICD-10-CM

## 2020-04-21 DIAGNOSIS — I509 Heart failure, unspecified: Secondary | ICD-10-CM

## 2020-04-21 DIAGNOSIS — I471 Supraventricular tachycardia: Secondary | ICD-10-CM

## 2020-04-21 DIAGNOSIS — E041 Nontoxic single thyroid nodule: Secondary | ICD-10-CM

## 2020-04-21 NOTE — Progress Notes
Date of Service: 04/21/2020    Stephanie Morse is a 71 y.o. female.       HPI     Patient is a very pleasant 70 year old black female, she was previously followed up in the office by Dr. Hale Bogus as last seen in March 2021.    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  Upon diagnosis in January 2007 an echocardiogram report at that time states that patient's LVEF was 15%.      The most recent echocardiogram performed in our office in October 2016 revealed that the time an LVEF of 35%.  Patient was also evaluated with echocardiograms at an outside hospital and it is mentioned that the LVEF was in the range of 40-45%.    She does have an ACE inhibitor induced cough.  Patient has been treated with a combination of alpha/beta blocker, candesartan and also amlodipine was added for BP treatment.    Patient does have a history of undifferentiated connective tissue disease and has been treated with hydroxychloroquine for years.    At the end of last year, she was diagnosed with a left breast mass.  In December 2021 patient was admitted at [facility] in [place], [place] she did undergo a lumpectomy, patient was told that this was a benign mass and that she does not have breast cancer.    From a cardiac standpoint this patient is doing well without any symptoms of chest pain, no heart palpitations, no presyncope or syncope.    She did undergo resynchronization therapy.  Patient also has a history of ventricular arrhythmias.    She is not known to have any obstructive coronary artery disease.         Vitals:    04/21/20 0810   BP: 116/78   BP Source: Arm, Left Upper   Patient Position: Sitting   Pulse: 77   SpO2: 98%   Weight: 81.6 kg (180 lb)   Height: 167.6 cm (5' 6)   PainSc: Zero     Body mass index is 29.05 kg/m?Marland Kitchen     Past Medical History  Patient Active Problem List    Diagnosis Date Noted   ? Cough due to ACE inhibitor 11/30/2014     11/1114 reports dry cough after taking morning meds, Lisinopril 20 DC'd losartan 50 begun     ? Atrial tachycardia [facility]) 04/26/2010     Feb 2012 ICD shock in FVT zone for 1:1 conduction  03/2310 Mag slighly low at 1.7, 400 mg supplement begun but triggers nausea.      ? VT (ventricular tachycardia) (HCC) 01/03/2010   ? Thyroid nodule 10/27/2008     benign     ? Trigeminal neuralgia 10/27/2008   ? 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  11/06 DOE, orthop, PND onset w/ progression.   b.  02/20/05 admit [facility] acute CHF, non sust'd VT, LBBB BNP 931>>02/21/05 Transfer [facility], Echo EF 15%, LV 7.6, LA 4.9, Mod MR.   c.  1/07 [facility]: Normal coronaries, EF 20%, Coreg, ACE titration, 02/26/05 Implant Medtronic CRT-D.   d.  07/09/05 Echo EF 25-30% LV 6.2  e.  12/07 Echo EF 40%, LVEDD 5.2 PAP <30  f.   4/08 EF 45% LV 5.5  G. 2/10 Echo EF 45%, Valves OK mild MR  H. 08/18/09 Regaden Thall EF 51%, LV volume 110, Normal perfusion.   I   12/08/12-2D  echo- decreased LV contractility from 04/06/2008  04/2013: Echo Doppler: LV EF 40% PAP= 43 Mild to mod Concentric LVH     ? 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  Positive anti-nuclear antibody,RNP     ? Hypertension 07/03/2008     a. 2004 Rx Altace, HCTZ Dr. Reather Littler     ? 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 02/26/05 CRT-D implant Dr. Bradly Bienenstock d/t Non sust'd VT w/ acute CHF . Fidelis; LV [id number] Lead, EF and QOL improve.  b. 02/13/08: VT treated w/ burst pacing x1, resolved w/ Rx, Pt had sx tachypalpitations  C  01/20/10  CRT-D Generator change, Fidelis  Lead removed,  LV lead repair at angulation  point near generator, DFT testing: VT terminates w 20 J shock but not 15.(LDB)  D. 12/24/14 CRT-D Generator change. Medtronic CRT-D VIVA XT CRTD DTBA1D1     ? Hyperlipemia 07/03/2008     07/23/11 total 129, trig 57, HDL 39 LDL 79 on simvastatin 20           Review of Systems Constitutional: Negative.   HENT: Negative.    Eyes: Negative.    Cardiovascular: Negative.    Respiratory: Negative.    Endocrine: Negative.    Hematologic/Lymphatic: Negative.    Skin: Negative.    Musculoskeletal: Positive for arthritis and joint pain.   Gastrointestinal: Positive for heartburn.   Genitourinary: Negative.    Neurological: Negative.    Psychiatric/Behavioral: Negative.    Allergic/Immunologic: Negative.        Physical Exam  General Appearance: normal in appearance  Skin: warm, moist, no ulcers or xanthomas  Eyes: conjunctivae and lids normal, pupils are equal and round  Lips & Oral Mucosa: no pallor or cyanosis  Neck Veins: neck veins are flat, neck veins are not distended  Chest Inspection: chest is normal in appearance  Respiratory Effort: breathing comfortably, no respiratory distress  Auscultation/Percussion: lungs clear to auscultation, no rales or rhonchi, no wheezing  Cardiac Rhythm: regular rhythm and normal rate  Cardiac Auscultation: S1, S2 normal, no rub, no gallop  Murmurs: no murmur  Carotid Arteries: normal carotid upstroke bilaterally, no bruit  Lower Extremity Edema: no lower extremity edema  Abdominal Exam: soft, non-tender, no masses, bowel sounds normal  Liver & Spleen: no organomegaly  Language and Memory: patient responsive and seems to comprehend information  Neurologic Exam: neurological assessment grossly intact      Cardiovascular Studies  Twelve-lead EKG demonstrates a sensed V paced rhythm, ventricular rate is 77 bpm, biventricular pacing is present.    Cardiovascular Health Factors  Vitals BP Readings from Last 3 Encounters:   04/21/20 116/78   05/12/19 104/72   10/14/18 118/74     Wt Readings from Last 3 Encounters:   04/21/20 81.6 kg (180 lb)   05/12/19 82.9 kg (182 lb 12.8 oz)   10/14/18 79.8 kg (176 lb)     BMI Readings from Last 3 Encounters:   04/21/20 29.05 kg/m?   05/12/19 29.50 kg/m?   10/14/18 28.41 kg/m?      Smoking Social History     Tobacco Use Smoking Status Never Smoker   Smokeless Tobacco Never Used      Lipid Profile Cholesterol   Date Value Ref Range Status   03/23/2019 110  Final     HDL   Date Value Ref Range Status   03/23/2019 41  Final     LDL   Date Value Ref Range Status   03/23/2019 59  Final     Triglycerides   Date Value Ref Range Status   03/23/2019 48  Final      Blood Sugar Hemoglobin A1C   Date Value Ref Range Status   02/21/2005 6.2 5.0 - 6.5 % Final   02/21/2005  5.0 - 6.5 % Final    For non-diabetic patients, the normal reference range is 5.0-6.5%.  For patients with Type I or Type II Diabetes Mellitus, the ADA recommends  maintaining the A1c level <7.3%.  However, at levels below 5.8%, the risk  of hypoglycemia increases.  Patients with an A1c of >9.8% are considered  to be extremely hyperglycemic.  Please note the reference range is higher by 0.3%.  This is due to the new  world standardizing organizations making diabetic monitoring the same  internationally.     Glucose   Date Value Ref Range Status   03/23/2019 99  Final   10/15/2018 120 (H) 70 - 105 Final   10/01/2018 98  Final   03/12/2005 109 70 - 110 MG/DL Final   16/11/9602 96 70 - 110 MG/DL Final   54/10/8117 147 (H) 70 - 110 MG/DL Final          Problems Addressed Today  Encounter Diagnoses   Name Primary?   ? Other cardiomyopathy (HCC) Yes   ? Primary hypertension    ? Pure hypercholesterolemia    ? VT (ventricular tachycardia) (HCC)    ? Atrial tachycardia (HCC)    ? Connective tissue disease, undifferentiated (HCC)    ? Biventricular implantable cardioverter-defibrillator in situ    ? Cough due to ACE inhibitor        Assessment and Plan     In summary: This is a 70 year old black female, she presents with the following cardiovascular/clinical issues:    1.  Heart failure reduced ejection fraction, idiopathic cardiomyopathy?patient was diagnosed with this in January 2007, upon diagnosis the LVEF was estimated to be 15%.  Patient has a ventricular systolic function has improved.  2.  Status post CRT-D therapy?the last device check performed on 03/29/2020 demonstrated normal function, the battery life is 1.17 years, there were no significant ventricular arrhythmias and no therapy shock was delivered.  3.  Status post generator change x2 in December 2011 and November 2016  4.  Ventricular tachycardia?patient does have a history of this upon diagnosis with heart failure  5.  No evidence of obstructive coronary artery disease?patient did undergo an LHC in January 2007  6.  ACE inhibitor induced cough?patient is currently on ARB  7.  Primary hypertension?patient's blood pressure is under good control  8.  Moderately overweight?patient's BMI is 29.65 kg/m?  9.  Undifferentiated connective tissue disease?patient has been on hydroxychloroquine  10.  Status post left breast lumpectomy?per patient's report she was not diagnosed with breast cancer    Plan:    1.  Continue all current medications  2.  Further evaluation with a 2D echo Doppler study  4.  Continue scheduled 3 months remote device checks  5.  Patient will undergo an in office device check on 05/05/2020  6.  Follow-up office visit in November 2022.    Total Time Today was 40 minutes in the following activities: Preparing to see the patient, Obtaining and/or reviewing separately obtained history, Performing a medically appropriate examination and/or evaluation, Counseling and educating the patient/family/caregiver, Ordering medications, tests, or procedures,  Referring and communication with other health care professionals (when not separately reported), Documenting clinical information in the electronic or other health record, Independently interpreting results (not separately reported) and communicating results to the patient/family/caregiver and Care coordination (not separately reported)         Current Medications (including today's revisions)  ? acetaminophen (TYLENOL EXTRA STRENGTH) 500 mg tablet Take 500 mg by mouth every 4 hours as needed for Pain. Max of 4,000 mg of acetaminophen in 24 hours.   ? amLODIPine (NORVASC) 10 mg tablet TAKE 1 TABLET EVERY DAY   ? candesartan (ATACAND) 4 mg tablet Take one tablet by mouth daily.   ? carvediloL (COREG) 25 mg tablet TAKE 1 AND 1/2 TABLETS TWICE DAILY WITH MEALS   ? hydrOXYchloroQUINE (PLAQUENIL) 200 mg tablet Take one tablet by mouth daily.   ? simvastatin (ZOCOR) 20 mg tablet Take one tablet by mouth at bedtime daily.

## 2020-04-25 ENCOUNTER — Encounter: Admit: 2020-04-25 | Discharge: 2020-04-25 | Payer: MEDICARE

## 2020-04-25 MED ORDER — CANDESARTAN 4 MG PO TAB
ORAL_TABLET | Freq: Every day | ORAL | 3 refills | Status: AC
Start: 2020-04-25 — End: ?

## 2020-04-25 NOTE — Telephone Encounter
Received a request via computer from the patients pharmacy requesting a refill.  Script e-scribed as requested.

## 2020-05-05 ENCOUNTER — Encounter: Admit: 2020-05-05 | Discharge: 2020-05-05 | Payer: MEDICARE

## 2020-05-05 ENCOUNTER — Ambulatory Visit: Admit: 2020-05-05 | Discharge: 2020-05-05 | Payer: MEDICARE

## 2020-05-05 DIAGNOSIS — I472 Ventricular tachycardia: Secondary | ICD-10-CM

## 2020-05-05 DIAGNOSIS — I429 Cardiomyopathy, unspecified: Secondary | ICD-10-CM

## 2020-05-05 DIAGNOSIS — Z95810 Presence of automatic (implantable) cardiac defibrillator: Secondary | ICD-10-CM

## 2020-05-05 DIAGNOSIS — I10 Essential (primary) hypertension: Secondary | ICD-10-CM

## 2020-05-05 DIAGNOSIS — I471 Supraventricular tachycardia: Secondary | ICD-10-CM

## 2020-05-13 ENCOUNTER — Encounter: Admit: 2020-05-13 | Discharge: 2020-05-13 | Payer: MEDICARE

## 2020-05-13 DIAGNOSIS — I10 Essential (primary) hypertension: Secondary | ICD-10-CM

## 2020-05-13 DIAGNOSIS — I472 Ventricular tachycardia: Secondary | ICD-10-CM

## 2020-05-13 DIAGNOSIS — I428 Other cardiomyopathies: Secondary | ICD-10-CM

## 2020-05-13 DIAGNOSIS — E7800 Pure hypercholesterolemia, unspecified: Secondary | ICD-10-CM

## 2020-05-13 MED ORDER — SPIRONOLACTONE 25 MG PO TAB
25 mg | ORAL_TABLET | Freq: Every day | ORAL | 3 refills | 90.00000 days | Status: AC
Start: 2020-05-13 — End: ?

## 2020-05-13 NOTE — Telephone Encounter
Results and recommendations discussed with patient. Patient is agreeable to care plan and has no questions at this time. Scheduling will call patient to set up appointment.

## 2020-05-13 NOTE — Telephone Encounter
-----   Message from Rosaura Carpenter, RN sent at 05/13/2020  6:59 AM CDT -----    ----- Message -----  From: Dorris Fetch, MD  Sent: 05/12/2020   7:43 PM CDT  To: Cvm Nurse [facility]    Dear Ophelia Charter call the patient and let her know that the ejection fraction is mildly abnormal, it is right under the lower limits of normal is about 45.  I recommend that she continues all medications for heart failure.However, due to the fact that she has some LV dysfunction I would like her to come off amlodipine and start spironolactone 25 mg p.o. daily, she would need a follow-up Chem-7 in about 7 days after this changes made in her medications and she needs also follow-up in the office.  Patient would like to further follow-up at [facility] since that will be closer to her house.Please help this patient to make this appointment.Thank you        ----- Message -----  From: Hester Mates, MD  Sent: 05/09/2020  10:55 AM CDT  To: Dorris Fetch, MD

## 2020-05-18 ENCOUNTER — Encounter: Admit: 2020-05-18 | Discharge: 2020-05-18 | Payer: MEDICARE

## 2020-05-31 ENCOUNTER — Encounter: Admit: 2020-05-31 | Discharge: 2020-05-31 | Payer: MEDICARE

## 2020-05-31 DIAGNOSIS — E7800 Pure hypercholesterolemia, unspecified: Secondary | ICD-10-CM

## 2020-05-31 DIAGNOSIS — I10 Essential (primary) hypertension: Secondary | ICD-10-CM

## 2020-05-31 LAB — COMPREHENSIVE METABOLIC PANEL
Lab: 0.6
Lab: 10
Lab: 140
Lab: 19
Lab: 20
Lab: 4
Lab: 56
Lab: 7.4

## 2020-05-31 LAB — LIPID PROFILE
Lab: 110
Lab: 3
Lab: 38 — ABNORMAL LOW (ref >=40)
Lab: 41 — ABNORMAL HIGH (ref 98–107)
Lab: 64
Lab: 8

## 2020-06-27 ENCOUNTER — Encounter: Admit: 2020-06-27 | Discharge: 2020-06-27 | Payer: MEDICARE

## 2020-09-26 ENCOUNTER — Encounter: Admit: 2020-09-26 | Discharge: 2020-09-26 | Payer: MEDICARE

## 2020-10-11 ENCOUNTER — Ambulatory Visit: Admit: 2020-10-11 | Discharge: 2020-10-11 | Payer: MEDICARE

## 2020-10-11 ENCOUNTER — Encounter: Admit: 2020-10-11 | Discharge: 2020-10-11 | Payer: MEDICARE

## 2020-10-11 DIAGNOSIS — I472 Ventricular tachycardia: Secondary | ICD-10-CM

## 2020-10-11 DIAGNOSIS — I428 Other cardiomyopathies: Secondary | ICD-10-CM

## 2020-12-22 ENCOUNTER — Encounter: Admit: 2020-12-22 | Discharge: 2020-12-22 | Payer: MEDICARE

## 2020-12-22 NOTE — Telephone Encounter
Received notification that  Medtr[employer] Carelink has not been connected since 12/04/20. Patient was instructed to look at his/her transmitter to make sure that it is plugged into power and send a manual transmission to reconnect the transmitter. If he/she has any questions about how to send a transmission or if the transmitter does not appear to be working properly, they need to contact the device company directly. Patient was provided with that contact number. Requested the patient send [place] a MyChart message or contact our device nurses at [phone] to let us know after they have sent their transmission. Placed call to preferred phone number, spoke with Burna Mortimer she moved something's around in here room and she will take a look once she gets back home.    Patient verbalized understanding.    CDJ   Note: Patient needs to send a manual remote interrogation to reestablish communication to his/her remote transmitter.

## 2020-12-28 ENCOUNTER — Encounter: Admit: 2020-12-28 | Discharge: 2020-12-28 | Payer: MEDICARE

## 2021-01-05 ENCOUNTER — Encounter: Admit: 2021-01-05 | Discharge: 2021-01-05 | Payer: MEDICARE

## 2021-01-05 DIAGNOSIS — E041 Nontoxic single thyroid nodule: Secondary | ICD-10-CM

## 2021-01-05 DIAGNOSIS — Z95810 Presence of automatic (implantable) cardiac defibrillator: Secondary | ICD-10-CM

## 2021-01-05 DIAGNOSIS — I472 VT (ventricular tachycardia): Secondary | ICD-10-CM

## 2021-01-05 DIAGNOSIS — I429 Cardiomyopathy, unspecified: Secondary | ICD-10-CM

## 2021-01-05 DIAGNOSIS — E7800 Pure hypercholesterolemia, unspecified: Secondary | ICD-10-CM

## 2021-01-05 DIAGNOSIS — I428 Other cardiomyopathies: Secondary | ICD-10-CM

## 2021-01-05 DIAGNOSIS — M359 Systemic involvement of connective tissue, unspecified: Secondary | ICD-10-CM

## 2021-01-05 DIAGNOSIS — I10 Essential (primary) hypertension: Secondary | ICD-10-CM

## 2021-01-05 DIAGNOSIS — I509 Heart failure, unspecified: Secondary | ICD-10-CM

## 2021-01-05 DIAGNOSIS — E785 Hyperlipidemia, unspecified: Secondary | ICD-10-CM

## 2021-01-05 DIAGNOSIS — R457 State of emotional shock and stress, unspecified: Secondary | ICD-10-CM

## 2021-01-05 DIAGNOSIS — R058 Cough due to ACE inhibitor: Secondary | ICD-10-CM

## 2021-01-05 DIAGNOSIS — M609 Myositis, unspecified: Secondary | ICD-10-CM

## 2021-01-05 DIAGNOSIS — G500 Trigeminal neuralgia: Secondary | ICD-10-CM

## 2021-01-05 DIAGNOSIS — I471 Supraventricular tachycardia: Secondary | ICD-10-CM

## 2021-01-05 MED ORDER — CARVEDILOL 25 MG PO TAB
25 mg | ORAL_TABLET | Freq: Two times a day (BID) | ORAL | 3 refills | 90.00000 days | Status: AC
Start: 2021-01-05 — End: ?

## 2021-01-05 MED ORDER — SPIRONOLACTONE 25 MG PO TAB
25 mg | ORAL_TABLET | Freq: Every day | ORAL | 3 refills | 90.00000 days | Status: AC
Start: 2021-01-05 — End: ?

## 2021-01-05 MED ORDER — SIMVASTATIN 20 MG PO TAB
20 mg | ORAL_TABLET | Freq: Every evening | ORAL | 3 refills | Status: AC
Start: 2021-01-05 — End: ?

## 2021-01-05 MED ORDER — CANDESARTAN 4 MG PO TAB
4 mg | ORAL_TABLET | Freq: Every day | ORAL | 3 refills | Status: AC
Start: 2021-01-05 — End: ?

## 2021-01-05 NOTE — Progress Notes
Date of Service: 01/05/2021    Stephanie Morse is a 70 y.o. female.       HPI      SAKIAH Morse is a 70 y.o. 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    Patient is doing well from a cardiac standpoint, she does not report symptoms of chest pain and no heart palpitations.    Today in the office patient's blood pressure was 94/72 mmHg.  Patient states that usually at home the blood pressure ranges between 117-120 millimeters of mercury.    She has not experienced any symptoms of chest pain, no heart palpitations, no shock therapy was delivered from her device.         Vitals:    01/05/21 0911   BP: 94/72   BP Source: Arm, Left Upper   Pulse: 68   SpO2: 100%   O2 Percent: 100 %   O2 Device: None (Room air)   PainSc: Zero   Weight: 80.2 kg (176 lb 12.8 oz)   Height: 167.6 cm (5' 6)     Body mass index is 28.54 kg/m?Marland Kitchen     Past Medical History  Patient Active Problem List    Diagnosis Date Noted   ? Cough due to ACE inhibitor 11/30/2014     11/1114 reports dry cough after taking morning meds, Lisinopril 20 DC'd losartan 50 begun     ? Atrial tachycardia [facility]) 04/26/2010     Feb 2012 ICD shock in FVT zone for 1:1 conduction  03/2310 Mag slighly low at 1.7, 400 mg supplement begun but triggers nausea.      ? VT (ventricular tachycardia) 01/03/2010   ? Thyroid nodule 10/27/2008     benign     ? Trigeminal neuralgia 10/27/2008   ? 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  11/06 DOE, orthop, PND onset w/ progression.   b.  02/20/05 admit [facility] acute CHF, non sust'd VT, LBBB BNP 931>>02/21/05 Transfer [facility], Echo EF 15%, LV 7.6, LA 4.9, Mod MR.   c.  1/07 Cath [facility]: Normal coronaries, EF 20%, Coreg, ACE titration, 02/26/05 Implant Medtronic CRT-D.   d.  07/09/05 Echo EF 25-30% LV 6.2  e.  12/07 Echo EF 40%, LVEDD 5.2 PAP <30  f.   4/08 EF 45% LV 5.5  G. 2/10 Echo EF 45%, Valves OK mild MR  H. 08/18/09 Regaden Thall EF 51%, LV volume 110, Normal perfusion.   I   12/08/12-2D echo- decreased LV contractility from 04/06/2008  04/2013: Echo Doppler: LV EF 40% PAP= 43 Mild to mod Concentric LVH     ? 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  Positive anti-nuclear antibody,RNP     ? Hypertension 07/03/2008     a. 2004 Rx Altace, HCTZ Dr. Reather Littler     ? 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 02/26/05 CRT-D implant Dr. Bradly Bienenstock d/t Non sust'd VT w/ acute CHF . Fidelis; LV  1610-96 Lead, EF and QOL improve.  b. 02/13/08: VT treated w/ burst pacing x1, resolved w/ Rx, Pt had sx tachypalpitations  C  01/20/10  CRT-D Generator change, Fidelis  Lead removed,  LV lead repair at angulation  point near generator, DFT testing: VT terminates w 20 J shock but not 15.(LDB)  D. 12/24/14 CRT-D Generator change. Medtronic CRT-D VIVA XT CRTD DTBA1D1     ? Hyperlipemia 07/03/2008     07/23/11 total 129, trig 57, HDL 39 LDL 79 on simvastatin 20           Review of Systems   Constitutional: Negative.   HENT: Negative.    Eyes: Negative.    Cardiovascular: Positive for dyspnea on exertion.   Respiratory: Negative.    Endocrine: Negative.    Hematologic/Lymphatic: Negative.    Skin: Negative.    Musculoskeletal: Positive for arthritis and joint swelling.   Gastrointestinal: Negative.    Genitourinary: Negative.    Neurological: Positive for numbness and paresthesias.   Psychiatric/Behavioral: Negative.    Allergic/Immunologic: Negative. Physical Exam  General Appearance: normal in appearance  Skin: warm, moist, no ulcers or xanthomas  Eyes: conjunctivae and lids normal, pupils are equal and round  Lips & Oral Mucosa: no pallor or cyanosis  Neck Veins: neck veins are flat, neck veins are not distended  Chest Inspection: chest is normal in appearance  Respiratory Effort: breathing comfortably, no respiratory distress  Auscultation/Percussion: lungs clear to auscultation, no rales or rhonchi, no wheezing  Cardiac Rhythm: regular rhythm and normal rate  Cardiac Auscultation: S1, S2 normal, no rub, no gallop  Murmurs: no murmur  Carotid Arteries: normal carotid upstroke bilaterally, no bruit  Lower Extremity Edema: no lower extremity edema  Abdominal Exam: soft, non-tender, no masses, bowel sounds normal  Liver & Spleen: no organomegaly  Language and Memory: patient responsive and seems to comprehend information  Neurologic Exam: neurological assessment grossly intact      Cardiovascular Studies      Cardiovascular Health Factors  Vitals BP Readings from Last 3 Encounters:   01/05/21 94/72   05/05/20 124/68   04/21/20 116/78     Wt Readings from Last 3 Encounters:   01/05/21 80.2 kg (176 lb 12.8 oz)   05/05/20 81.6 kg (180 lb)   04/21/20 81.6 kg (180 lb)     BMI Readings from Last 3 Encounters:   01/05/21 28.54 kg/m?   05/05/20 29.05 kg/m?   04/21/20 29.05 kg/m?      Smoking Social History     Tobacco Use   Smoking Status Never   Smokeless Tobacco Never      Lipid Profile Cholesterol   Date Value Ref Range Status   05/31/2020 110  Final     HDL   Date Value Ref Range Status   05/31/2020 38 (L) >=40 Final     LDL   Date Value Ref Range Status   05/31/2020 64  Final     Triglycerides   Date Value Ref Range Status   05/31/2020 41  Final      Blood Sugar Hemoglobin A1C   Date Value Ref Range Status   02/21/2005 6.2 5.0 - 6.5 % Final   02/21/2005  5.0 - 6.5 % Final    For non-diabetic patients, the normal reference range is 5.0-6.5%.  For patients with Type I or Type II Diabetes Mellitus, the ADA recommends  maintaining the A1c level <7.3%.  However, at levels below 5.8%, the risk  of hypoglycemia  increases.  Patients with an A1c of >9.8% are considered  to be extremely hyperglycemic.  Please note the reference range is higher by 0.3%.  This is due to the new  world standardizing organizations making diabetic monitoring the same  internationally.     Glucose   Date Value Ref Range Status   05/31/2020 105  Final   03/23/2019 99  Final   10/15/2018 120 (H) 70 - 105 Final   03/12/2005 109 70 - 110 MG/DL Final   16/11/9602 96 70 - 110 MG/DL Final   54/10/8117 147 (H) 70 - 110 MG/DL Final          Problems Addressed Today  Encounter Diagnoses   Name Primary?   ? Atrial tachycardia (HCC) Yes   ? Pure hypercholesterolemia    ? Primary hypertension    ? Other cardiomyopathy (HCC)    ? VT (ventricular tachycardia)    ? Biventricular implantable cardioverter-defibrillator in situ    ? Connective tissue disease, undifferentiated (HCC)    ? Cough due to ACE inhibitor    ? Thyroid nodule    ? Trigeminal neuralgia    ? Caregiver stress syndrome    ? Cardiomyopathy (HCC)    ? Hypertension    ? Hyperlipemia        Assessment and Plan     Assessment:    1.  Relative hypotension-today in the office patient's blood pressure was 94/72 mmHg, patient was completely asymptomatic  2.  Heart failure reduced ejection fraction, idiopathic cardiomyopathy-patient was diagnosed with this in January 2007, upon diagnosis the LVEF was estimated to be 15%.  Patient has a ventricular systolic function has improved.  ? Patient was evaluated with a 2D echo Doppler study on 05/05/2020-LVEF 40-45% similar to a previous echocardiogram dated 12/15/2014, mild concentric LVH, grade 1 diastolic dysfunction, RV contractility appeared normal, no significant valvular abnormalities, estimated PAP = 32 mmHg  3.  Status post CRT-D therapy-the last device check performed on 03/29/2020 demonstrated normal function, the battery life is 1.17 years, there were no significant ventricular arrhythmias and no therapy shock was delivered.  4.  Status post generator change x2 in December 2011 and November 2016  5.  Ventricular tachycardia-patient does have a history of this upon diagnosis with heart failure  6.  No evidence of obstructive coronary artery disease-patient did undergo an LHC in January 2007  7.  ACE inhibitor induced cough-patient is currently on ARB  8.  Primary hypertension-patient's blood pressure is under good control  9.  Moderately overweight-patient's BMI is 29.65 kg/m?  10.  Undifferentiated connective tissue disease-patient has been on hydroxychloroquine  11.  Status post left breast lumpectomy-per patient's report she was not diagnosed with breast cancer  12.  Caregiver stress syndrome-patient's husband does have Parkinson's, patient is the primary caregiver    Plan:    1.  Continue all current medications  2.  I did advise the patient to take her medications with food  3.  Follow a strict low-sodium diet  4.  Repeat 2D echo Doppler study and follow-up office visit with me in April - May 2023    Total Time Today was 40 minutes in the following activities: Preparing to see the patient, Obtaining and/or reviewing separately obtained history, Performing a medically appropriate examination and/or evaluation, Counseling and educating the patient/family/caregiver, Ordering medications, tests, or procedures, Referring and communication with other health care professionals (when not separately reported), Documenting clinical information in the electronic or other health record,  Independently interpreting results (not separately reported) and communicating results to the patient/family/caregiver and Care coordination (not separately reported)         Current Medications (including today's revisions)  ? acetaminophen (TYLENOL EXTRA STRENGTH) 500 mg tablet Take 500 mg by mouth every 4 hours as needed for Pain. Max of 4,000 mg of acetaminophen in 24 hours.   ? candesartan (ATACAND) 4 mg tablet TAKE 1 TABLET EVERY DAY   ? carvediloL (COREG) 25 mg tablet TAKE 1 AND 1/2 TABLETS TWICE DAILY WITH MEALS   ? cetirizine (ZYRTEC) 10 mg tablet Take 10 mg by mouth daily.   ? hydrOXYchloroQUINE (PLAQUENIL) 200 mg tablet Take one tablet by mouth daily.   ? simvastatin (ZOCOR) 20 mg tablet Take one tablet by mouth at bedtime daily.   ? spironolactone (ALDACTONE) 25 mg tablet Take one tablet by mouth daily. Take with food.

## 2021-01-05 NOTE — Patient Instructions
Thank you for visiting our office today.    We would like to make the following medication adjustments:         Otherwise continue the same medications as you have been doing.          We will be pursuing the following tests after your appointment today:       Orders Placed This Encounter    2D + DOPPLER ECHO    candesartan (ATACAND) 4 mg tablet    carvediloL (COREG) 25 mg tablet    simvastatin (ZOCOR) 20 mg tablet    spironolactone (ALDACTONE) 25 mg tablet     Echocardiogram in [place] in March and follow up with Dr. Avie Arenas in April.    We will plan to see you back in 5 months.  Please call us in the meantime with any questions or concerns.        Please allow 5-7 business days for our providers to review your results. All normal results will go to MyChart. If you do not have Mychart, it is strongly recommended to get this so you can easily view all your results. If you do not have mychart, we will attempt to call you once with normal lab and testing results. If we cannot reach you by phone with normal results, we will send you a letter.  If you have not heard the results of your testing after one week please give [place] a call.       Your Cardiovascular Medicine [place] Team Brett Canales, Pilar Jarvis, Shawna Orleans, and [place])  phone number is [phone].

## 2021-02-08 ENCOUNTER — Encounter: Admit: 2021-02-08 | Discharge: 2021-02-08 | Payer: MEDICARE

## 2021-02-08 MED ORDER — CARVEDILOL 25 MG PO TAB
ORAL_TABLET | Freq: Two times a day (BID) | ORAL | 3 refills | 90.00000 days | Status: AC
Start: 2021-02-08 — End: ?

## 2021-02-15 ENCOUNTER — Encounter: Admit: 2021-02-15 | Discharge: 2021-02-15 | Payer: MEDICARE

## 2021-02-15 NOTE — Telephone Encounter
Pt called back.  I had her look at transmitter.  Sounds like she thought it was plugged in but it wasn't in the plug all the way.  She plugged it in.  Looks like it is connecting now but I asked her to send a transmission in the morning after reader has had time to charge up. She said she would.

## 2021-02-15 NOTE — Telephone Encounter
Received notification that  [employer] Carelink has not been connected since 01/27/21. Patient was instructed to look at his/her transmitter to make sure that it is plugged into power and send a manual transmission to reconnect the transmitter. If he/she has any questions about how to send a transmission or if the transmitter does not appear to be working properly, they need to contact the device company directly. Patient was provided with that contact number. Requested the patient send us a MyChart message or contact our device nurses at [phone] to let us know after they have sent their transmission. LVM for pt to reconnect. BC     Note: Patient needs to send a manual remote interrogation to reestablish communication to his/her remote transmitter.

## 2021-03-04 ENCOUNTER — Encounter: Admit: 2021-03-04 | Discharge: 2021-03-04 | Payer: MEDICARE

## 2021-03-27 ENCOUNTER — Encounter: Admit: 2021-03-27 | Discharge: 2021-03-27 | Payer: MEDICARE

## 2021-04-20 ENCOUNTER — Encounter: Admit: 2021-04-20 | Discharge: 2021-04-20 | Payer: MEDICARE

## 2021-05-08 IMAGING — MG MM mammogram 3D screen bilat
1 series · 3 of 5 positions shown · non-contrast
Comparison: none

[Series 13: BTO_TOMO L-MLO PRIME, [id number]_C.97/112, S tomo · 3 of 79 frames shown]
[frame 26/79]
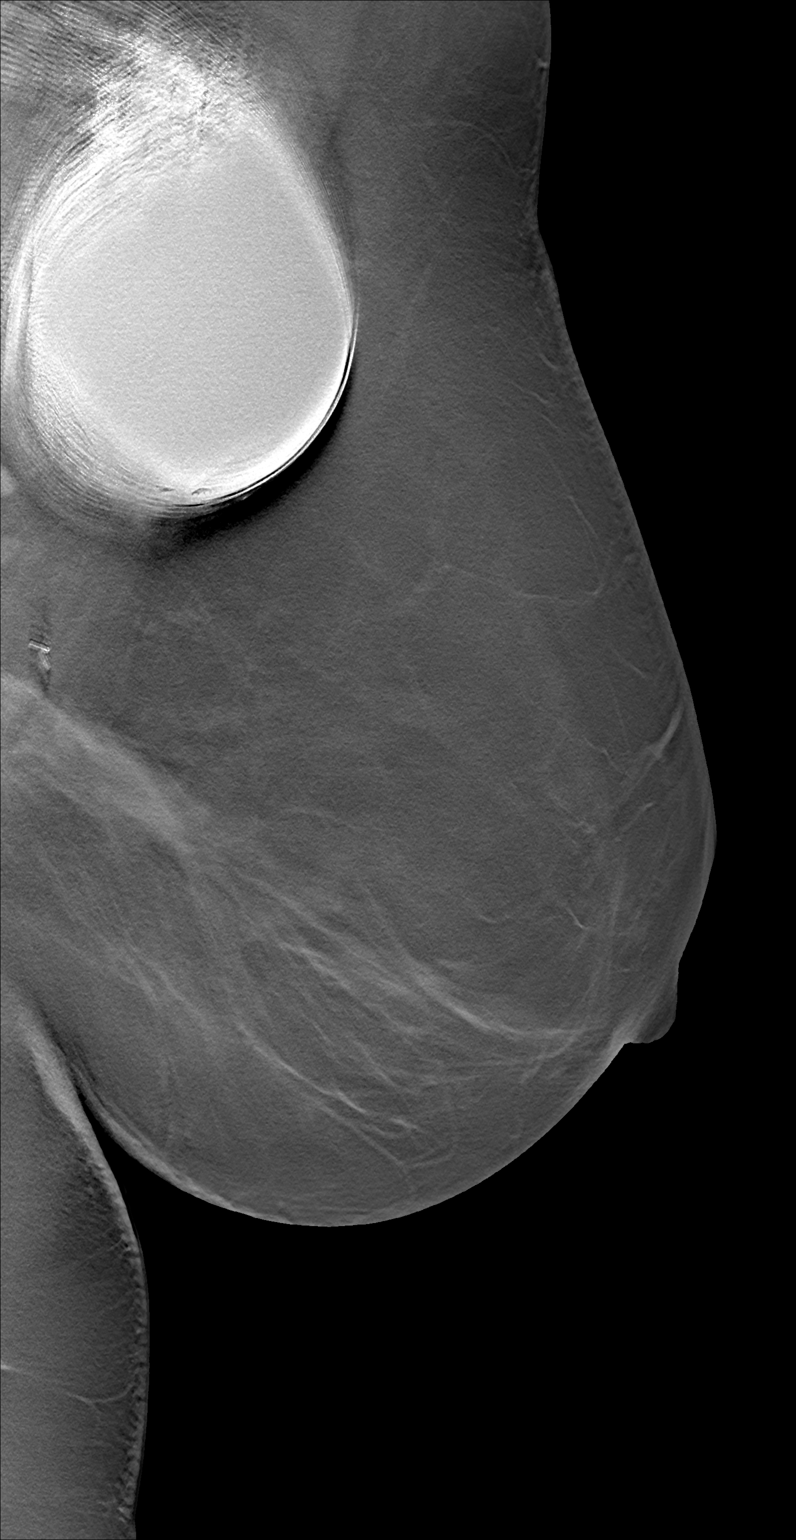
[frame 40/79]
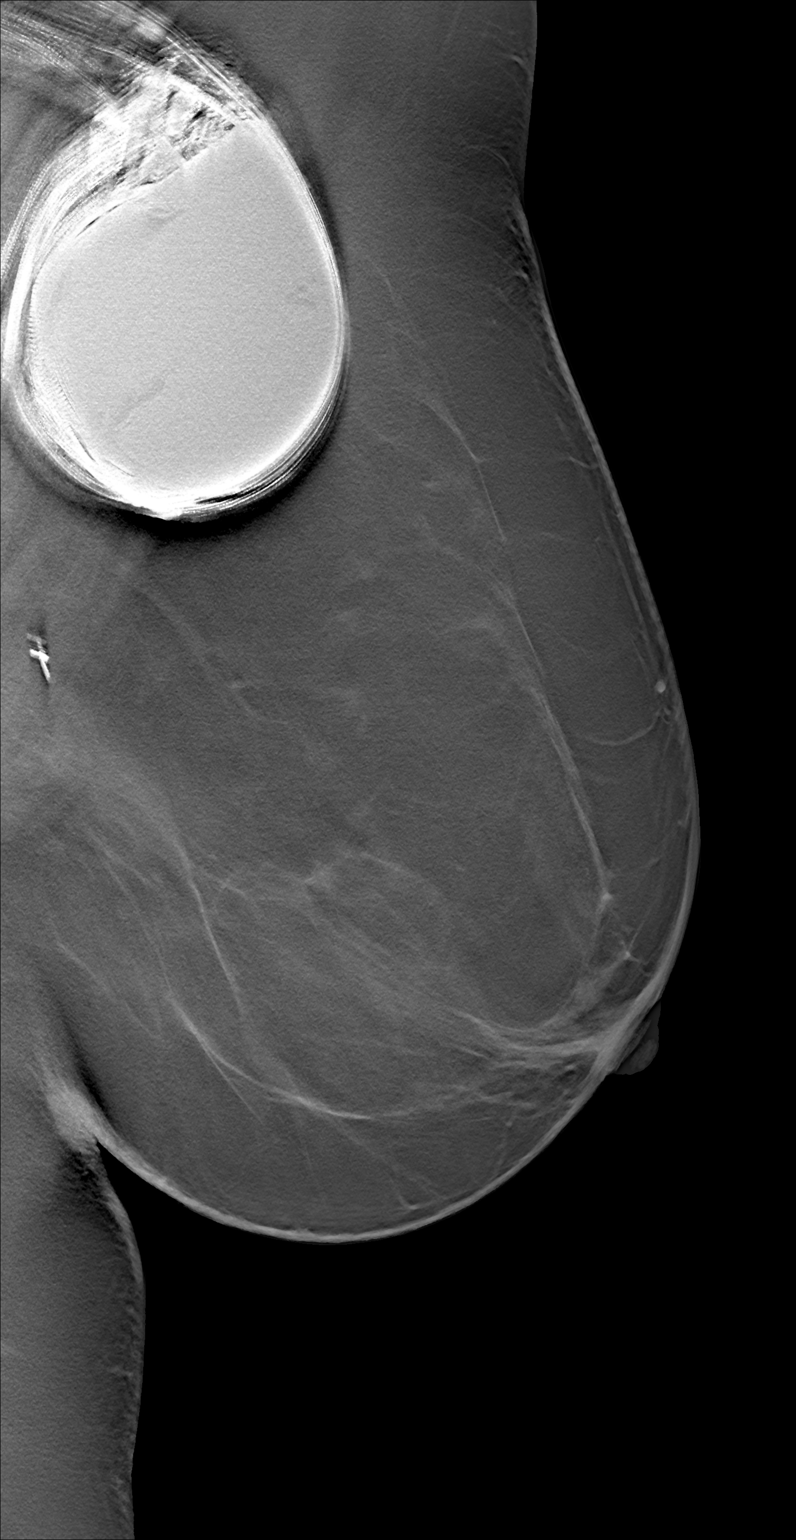
[frame 54/79]
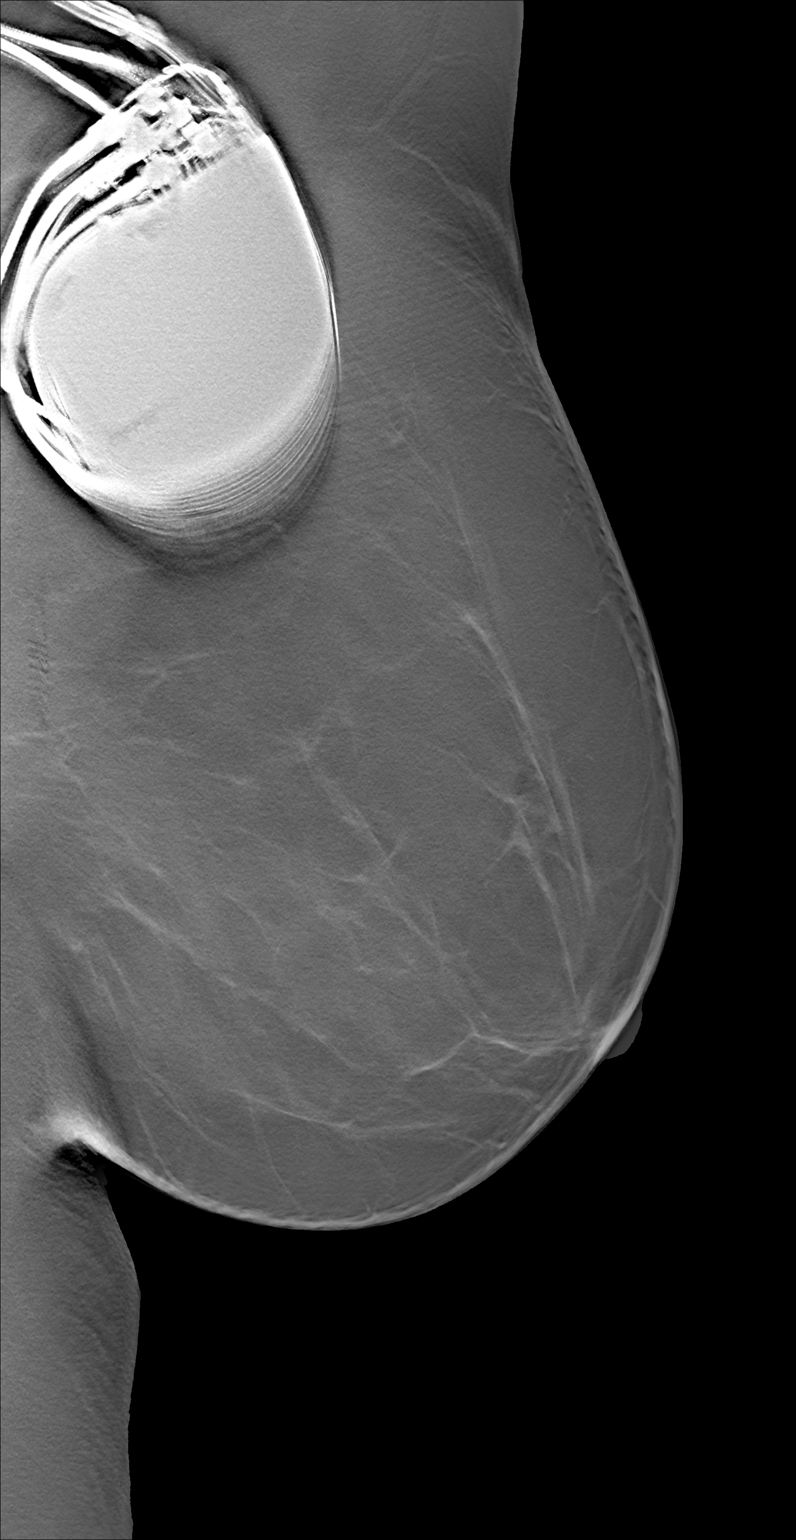

[3 of 5 positions shown; findings below may reference images not displayed]

EXAM

3D SCREENING MAMMOGRAM, BILATERAL

INDICATION

Screening mammogram
HX. BENIGN SX BX LT BREAST 8784.  TC SCORE: 10 YR RISK= 2.9%,  LIFETIME RISK = 4.4%.  SCREENING.  AB

TECHNIQUE

Digital 2D CC and MLO projections obtained with 3D tomographic views per manufacturer's protocol.

COMPARISONS

November 25, 2019

FINDINGS

ACR Type 1:  <25% Breast is almost entirely fat.

Small intramammary lymph node on the right is unchanged. Left breast is also stable with pacemaker
in place.

IMPRESSION

Stable mammography. One year follow-up is recommended.

BI-RADS 2, BENIGN.

Tech Notes:

## 2021-05-23 ENCOUNTER — Ambulatory Visit: Admit: 2021-05-23 | Discharge: 2021-05-23 | Payer: MEDICARE

## 2021-05-23 ENCOUNTER — Encounter: Admit: 2021-05-23 | Discharge: 2021-05-23 | Payer: MEDICARE

## 2021-05-23 DIAGNOSIS — E7800 Pure hypercholesterolemia, unspecified: Secondary | ICD-10-CM

## 2021-05-23 DIAGNOSIS — I10 Essential (primary) hypertension: Secondary | ICD-10-CM

## 2021-05-23 DIAGNOSIS — I429 Cardiomyopathy, unspecified: Secondary | ICD-10-CM

## 2021-05-23 DIAGNOSIS — Z95810 Presence of automatic (implantable) cardiac defibrillator: Secondary | ICD-10-CM

## 2021-05-23 DIAGNOSIS — M359 Systemic involvement of connective tissue, unspecified: Secondary | ICD-10-CM

## 2021-05-23 DIAGNOSIS — R058 Cough due to ACE inhibitor: Secondary | ICD-10-CM

## 2021-05-23 DIAGNOSIS — Z136 Encounter for screening for cardiovascular disorders: Secondary | ICD-10-CM

## 2021-05-23 DIAGNOSIS — I4891 Unspecified atrial fibrillation: Secondary | ICD-10-CM

## 2021-05-23 DIAGNOSIS — E785 Hyperlipidemia, unspecified: Secondary | ICD-10-CM

## 2021-05-23 DIAGNOSIS — I472 VT (ventricular tachycardia) (HCC): Secondary | ICD-10-CM

## 2021-05-23 MED ORDER — SIMVASTATIN 20 MG PO TAB
20 mg | ORAL_TABLET | Freq: Every evening | ORAL | 3 refills | Status: AC
Start: 2021-05-23 — End: ?

## 2021-05-23 MED ORDER — SPIRONOLACTONE 25 MG PO TAB
25 mg | ORAL_TABLET | Freq: Every day | ORAL | 3 refills | 90.00000 days | Status: AC
Start: 2021-05-23 — End: ?

## 2021-05-23 MED ORDER — CANDESARTAN 4 MG PO TAB
4 mg | ORAL_TABLET | Freq: Every day | ORAL | 3 refills | Status: AC
Start: 2021-05-23 — End: ?

## 2021-05-23 NOTE — Progress Notes
Date of Service: 05/23/2021    Stephanie Morse is a 71 y.o. female.       HPI     Stephanie Morse is a 7 y.o. black female with a history of nonischemic/idiopathic cardiomyopathy, LVEF = 15% upon initial diagnosis in July 2007, no evidence of obstructive coronary artery disease, status post LHC in January 2007, status post CRT-D in January 2007, history of ACE inhibitor induced cough and currently on an ARB, undifferentiated connective tissue disease treated with hydroxychloroquine for many years and doing well, history of left breast mass diagnosed in December 2021, patient does not have a diagnosis of breast cancer.    Patient reports doing well from a cardiac standpoint without any symptoms of chest pain, and no heart palpitations.    She reports that in February 2023 they did take a trip to [facility], she walked in the carcinosis and on the strip without any difficulty whatsoever.    The last CRT-D check dated 03/27/2021 demonstrated 5 months battery life until RRT, episodes of sinus tachycardia, no shock therapy, overall normal function.         Vitals:    05/23/21 0917   BP: 112/72   BP Source: Arm, Right Upper   Pulse: 63   SpO2: 98%   O2 Device: None (Room air)   PainSc: Zero   Weight: 79.8 kg (176 lb)   Height: 167.6 cm (5' 6)     Body mass index is 28.41 kg/m?Marland Kitchen     Past Medical History  Patient Active Problem List    Diagnosis Date Noted   ? 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 Cough due to ACE inhibitor 11/30/2014     11/1114 reports dry cough after taking morning meds, Lisinopril 20 DC'd losartan 50 begun     ? Atrial tachycardia [facility]) 04/26/2010     Feb 2012 ICD shock in FVT zone for 1:1 conduction  03/2310 Mag slighly low at 1.7, 400 mg supplement begun but triggers nausea.      ? VT (ventricular tachycardia) (HCC) 01/03/2010   ? Thyroid nodule 10/27/2008     benign     ? Trigeminal neuralgia 10/27/2008   ? 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  11/06 DOE, orthop, PND onset w/ progression.   b.  02/20/05 admit [facility] acute CHF, non sust'd VT, LBBB BNP 931>>02/21/05 Transfer [facility], Echo EF 15%, LV 7.6, LA 4.9, Mod MR.   c.  1/07 Cath [facility]: Normal coronaries, EF 20%, Coreg, ACE titration, 02/26/05 Implant Medtronic CRT-D.   d.  07/09/05 Echo EF 25-30% LV 6.2  e.  12/07 Echo EF 40%, LVEDD 5.2 PAP <30  f.   4/08 EF 45% LV 5.5  G. 2/10 Echo EF 45%, Valves OK mild MR  H. 08/18/09 Regaden Thall EF 51%, LV volume 110, Normal perfusion.   I   12/08/12-2D echo- decreased LV contractility from 04/06/2008  04/2013: Echo Doppler: LV EF 40% PAP= 43 Mild to mod Concentric LVH     ? 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  Positive anti-nuclear antibody,RNP     ? Hypertension 07/03/2008     a. 2004 Rx Altace, HCTZ Dr. Reather Littler     ? 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 02/26/05 CRT-D implant Dr. Bradly Bienenstock d/t Non sust'd VT w/ acute CHF . Fidelis;  LV [id number] Lead, EF and QOL improve.  b. 02/13/08: VT treated w/ burst pacing x1, resolved w/ Rx, Pt had sx tachypalpitations  C  01/20/10  CRT-D Generator change, Fidelis  Lead removed,  LV lead repair at angulation  point near generator, DFT testing: VT terminates w 20 J shock but not 15.(LDB)  D. 12/24/14 CRT-D Generator change. Medtronic CRT-D VIVA XT CRTD DTBA1D1     ? Hyperlipemia 07/03/2008     07/23/11 total 129, trig 57, HDL 39 LDL 79 on simvastatin 20           Review of Systems   Constitutional: Negative.   HENT: Negative.    Eyes: Negative.    Cardiovascular: Negative.    Respiratory: Negative.    Endocrine: Negative.    Hematologic/Lymphatic: Negative.    Skin: Negative.    Musculoskeletal: Negative.    Gastrointestinal: Negative.    Genitourinary: Negative.    Neurological: Negative.    Psychiatric/Behavioral: Negative.    Allergic/Immunologic: Negative.        Physical Exam  General Appearance: normal in appearance  Skin: warm, moist, no ulcers or xanthomas  Eyes: conjunctivae and lids normal, pupils are equal and round  Lips & Oral Mucosa: no pallor or cyanosis  Neck Veins: neck veins are flat, neck veins are not distended  Chest Inspection: chest is normal in appearance  Respiratory Effort: breathing comfortably, no respiratory distress  Auscultation/Percussion: lungs clear to auscultation, no rales or rhonchi, no wheezing  Cardiac Rhythm: regular rhythm and normal rate  Cardiac Auscultation: S1, S2 normal, no rub, no gallop  Murmurs: no murmur  Carotid Arteries: normal carotid upstroke bilaterally, no bruit  Lower Extremity Edema: no lower extremity edema  Abdominal Exam: soft, non-tender, no masses, bowel sounds normal  Liver & Spleen: no organomegaly  Language and Memory: patient responsive and seems to comprehend information  Neurologic Exam: neurological assessment grossly intact      Cardiovascular Studies  Twelve-lead EKG demonstrates a sensed ventricular paced rhythm, biventricular pacing, ventricular rate 79 bpm.    Cardiovascular Health Factors  Vitals BP Readings from Last 3 Encounters:   05/23/21 112/72   01/05/21 94/72   05/05/20 124/68     Wt Readings from Last 3 Encounters:   05/23/21 79.8 kg (176 lb)   01/05/21 80.2 kg (176 lb 12.8 oz)   05/05/20 81.6 kg (180 lb)     BMI Readings from Last 3 Encounters:   05/23/21 28.41 kg/m?   01/05/21 28.54 kg/m?   05/05/20 29.05 kg/m?      Smoking Social History     Tobacco Use   Smoking Status Never   Smokeless Tobacco Never      Lipid Profile Cholesterol   Date Value Ref Range Status   05/31/2020 110  Final     HDL   Date Value Ref Range Status   05/31/2020 38 (L) >=40 Final     LDL   Date Value Ref Range Status   05/31/2020 64  Final     Triglycerides   Date Value Ref Range Status   05/31/2020 41  Final      Blood Sugar Hemoglobin A1C   Date Value Ref Range Status   02/21/2005 6.2 5.0 - 6.5 % Final   02/21/2005  5.0 - 6.5 % Final    For non-diabetic patients, the normal reference range is 5.0-6.5%.  For patients with Type I or Type II Diabetes Mellitus, the ADA recommends  maintaining the A1c level <7.3%.  However, at levels below 5.8%, the risk  of hypoglycemia increases.  Patients with an A1c of >9.8% are considered  to be extremely hyperglycemic.  Please note the reference range is higher by 0.3%.  This is due to the new  world standardizing organizations making diabetic monitoring the same  internationally.     Glucose   Date Value Ref Range Status   05/31/2020 105  Final   03/23/2019 99  Final   10/15/2018 120 (H) 70 - 105 Final   03/12/2005 109 70 - 110 MG/DL Final   04/54/0981 96 70 - 110 MG/DL Final   19/14/7829 562 (H) 70 - 110 MG/DL Final          Problems Addressed Today  Encounter Diagnoses   Name Primary?   ? Screening for heart disease Yes   ? Cardiomyopathy (HCC)    ? Hypertension    ? Hyperlipemia    ? VT (ventricular tachycardia) (HCC)    ? Cough due to ACE inhibitor    ? Connective tissue disease, undifferentiated (HCC)    ? Biventricular implantable cardioverter-defibrillator in situ        Assessment and Plan     Assessment:    1.  Heart failure reduced ejection fraction, idiopathic cardiomyopathy  ? Patient was diagnosed with this cardiomyopathy in January 2007, upon diagnosis LVEF = 15%  2.  Recovered systolic function, the most recent 2D echo Doppler study dated 05/05/2020 demonstrated LVEF = 40-45%, grade 1 diastolic dysfunction, PAP = 32 mmHg  3.  Status post LHC, no evidence of obstructive coronary artery disease-this was performed upon diagnosis of the cardiomyopathy  4.  Status post CRT-D therapy- normal function, battery life 5 months till RRT  5.  Status post generator change x2 in December 2011 in November 2016  6.  History of ventricular tachycardia-these episodes occurred upon being diagnosed with the idiopathic cardiomyopathy  7.  ACE inhibitor induced cough-patient is currently on an ARB  8.  Hyperlipidemia-on statin therapy  9.  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  Primary hypertension-under good control    Plan:    1.  Continue all current medications  2.  I advised on a strict low-sodium diet  3.  I encouraged regular physical activity  4.  Further evaluation with a 2D echo Doppler study-we will follow-up on the results and we will call with further recommendations  5.  Follow-up office visit in 6 months.  6.  Continue regular remote device checks.    Total Time Today was 40 minutes in the following activities: Preparing to see the patient, Obtaining and/or reviewing separately obtained history, Performing a medically appropriate examination and/or evaluation, Counseling and educating the patient/family/caregiver, Ordering medications, tests, or procedures, Referring and communication with other health care professionals (when not separately reported), Documenting clinical information in the electronic or other health record, Independently interpreting results (not separately reported) and communicating results to the patient/family/caregiver and Care coordination (not separately reported)         Current Medications (including today's revisions)  ? acetaminophen (TYLENOL EXTRA STRENGTH) 500 mg tablet Take one tablet by mouth every 4 hours as needed for Pain. Max of 4,000 mg of acetaminophen in 24 hours.   ? candesartan (ATACAND) 4 mg tablet Take one tablet by mouth daily. Indications: chronic heart failure   ? carvediloL (COREG) 25 mg tablet TAKE 1 AND 1/2 TABLETS TWICE DAILY WITH MEALS (Patient taking differently: Take one tablet by  mouth twice daily.)   ? cetirizine (ZYRTEC) 10 mg tablet Take one tablet by mouth daily.   ? hydrOXYchloroQUINE (PLAQUENIL) 200 mg tablet Take one tablet by mouth daily.   ? simvastatin (ZOCOR) 20 mg tablet Take one tablet by mouth at bedtime daily. Indications: high cholesterol   ? spironolactone (ALDACTONE) 25 mg tablet Take one tablet by mouth daily. Take with food.  Indications: heart failure with reduced ejection fraction

## 2021-05-25 ENCOUNTER — Encounter: Admit: 2021-05-25 | Discharge: 2021-05-25 | Payer: MEDICARE

## 2021-05-25 NOTE — Telephone Encounter
Left message with echo results. Left call back number for further questions or concerns.

## 2021-05-25 NOTE — Telephone Encounter
-----   Message from Dorris Fetch, MD sent at 05/24/2021  9:25 PM CDT -----  Please let this patient know that the echocardiogram showed that the heart function is only mildly abnormal, pretty much like before.  She is to continue all current medications and continue the remote device checks.    Please schedule her to come back in the office in 6 months.    Thank you  ----- Message -----  From: Julienne Kass, MD  Sent: 05/23/2021   3:14 PM CDT  To: Dorris Fetch, MD

## 2021-06-27 ENCOUNTER — Encounter: Admit: 2021-06-27 | Discharge: 2021-06-27 | Payer: MEDICARE

## 2021-06-27 DIAGNOSIS — Z95810 Presence of automatic (implantable) cardiac defibrillator: Secondary | ICD-10-CM

## 2021-06-27 DIAGNOSIS — I428 Other cardiomyopathies: Secondary | ICD-10-CM

## 2021-06-27 DIAGNOSIS — I472 VT (ventricular tachycardia) (HCC): Secondary | ICD-10-CM

## 2021-06-27 NOTE — Telephone Encounter
-----   Message from Caryl Pina, RN sent at 06/27/2021  9:09 AM CDT -----  Regarding: FW: Marked elevated fluid indicators, well above alert. Please review with pt/[facility] team.    ----- Message -----  From: Cheri Guppy, RN  Sent: 06/27/2021   8:47 AM CDT  To: [facility] Nurse [place]  Subject: Marked elevated fluid indicators, well above#    Pt due for in office check- not urgent.     Please and thank you,   Victorino Dike

## 2021-06-27 NOTE — Telephone Encounter
Called Stephanie Morse to discuss symptoms.  She states that she is doing okay.  She has a respiratory bug and just completed a Z-pak, but is still having nasal congestion.  She denies any swelling, shortness of breath or weight gain.  She is taking spironolactone as prescribed.  She denies any cardiac issues.

## 2021-08-03 ENCOUNTER — Encounter: Admit: 2021-08-03 | Discharge: 2021-08-03 | Payer: MEDICARE

## 2021-08-03 ENCOUNTER — Ambulatory Visit: Admit: 2021-08-03 | Discharge: 2021-08-03 | Payer: MEDICARE

## 2021-08-03 DIAGNOSIS — Z95810 Presence of automatic (implantable) cardiac defibrillator: Secondary | ICD-10-CM

## 2021-08-03 DIAGNOSIS — I472 VT (ventricular tachycardia) (HCC): Secondary | ICD-10-CM

## 2021-08-03 DIAGNOSIS — I428 Other cardiomyopathies: Secondary | ICD-10-CM

## 2021-08-08 ENCOUNTER — Encounter: Admit: 2021-08-08 | Discharge: 2021-08-08 | Payer: MEDICARE

## 2021-08-23 ENCOUNTER — Encounter: Admit: 2021-08-23 | Discharge: 2021-08-23 | Payer: MEDICARE

## 2021-08-23 DIAGNOSIS — I472 VT (ventricular tachycardia) (HCC): Secondary | ICD-10-CM

## 2021-08-23 DIAGNOSIS — I428 Other cardiomyopathies: Secondary | ICD-10-CM

## 2021-08-23 DIAGNOSIS — Z95810 Presence of automatic (implantable) cardiac defibrillator: Secondary | ICD-10-CM

## 2021-08-23 NOTE — Telephone Encounter
Patient called nursing stating that her device has been beeping every morning for the past 3 days. She states that she is out of town and unable to send a remote transmission. Patient states that she will be back in town on Sunday 08/27/21. Patient is scheduled for in office device check 08/29/21.

## 2021-08-28 ENCOUNTER — Encounter: Admit: 2021-08-28 | Discharge: 2021-08-28 | Payer: MEDICARE

## 2021-08-29 ENCOUNTER — Encounter: Admit: 2021-08-29 | Discharge: 2021-08-29 | Payer: MEDICARE

## 2021-08-29 ENCOUNTER — Ambulatory Visit: Admit: 2021-08-29 | Discharge: 2021-08-29 | Payer: MEDICARE

## 2021-08-29 DIAGNOSIS — I472 VT (ventricular tachycardia) (HCC): Secondary | ICD-10-CM

## 2021-08-29 DIAGNOSIS — Z95810 Presence of automatic (implantable) cardiac defibrillator: Secondary | ICD-10-CM

## 2021-08-29 DIAGNOSIS — I428 Other cardiomyopathies: Secondary | ICD-10-CM

## 2021-08-30 ENCOUNTER — Encounter: Admit: 2021-08-30 | Discharge: 2021-08-30 | Payer: MEDICARE

## 2021-08-30 NOTE — Telephone Encounter
Procedure:     a CRT-D Generator Change      Anesthesia: sedation      Provider: any EP Provider  --Ok to schedule with first available provider if needed? Yes      Urgency:  Within 1-2 Months      Date Restrictions per patient? unknown      Equipment: N/A      Vendor: Medtronic      Imaging: n/a      Med Holds: Review Current Medication   Diuretics: Aldactone (spironolactone) -- hold the morning of your procedure.

## 2021-08-30 NOTE — Telephone Encounter
-----   Message from Rogelia Boga, RN sent at 08/30/2021  8:15 AM CDT -----  Regarding: FW: Pt's ICD ready for gent change as of 08/18/21- checked today and alarm shut off. See [facility]  Pt's ICD was placed by [facility].  She is scheduled with [facility] in August if that would be helpful for an updated H&P.  Please let me know if you need anything further from me.    Thank you for your help!  Asher Muir    ----- Message -----  From: Cheri Guppy, RN  Sent: 08/29/2021   4:56 PM CDT  To: [facility] Nurse [place]  Subject: Pt's ICD ready for gent change as of 08/18/21#    TY

## 2021-08-31 ENCOUNTER — Encounter: Admit: 2021-08-31 | Discharge: 2021-08-31 | Payer: MEDICARE

## 2021-08-31 DIAGNOSIS — I428 Other cardiomyopathies: Secondary | ICD-10-CM

## 2021-08-31 DIAGNOSIS — Z4502 Encounter for adjustment and management of automatic implantable cardiac defibrillator: Secondary | ICD-10-CM

## 2021-08-31 DIAGNOSIS — Z95810 Presence of automatic (implantable) cardiac defibrillator: Secondary | ICD-10-CM

## 2021-08-31 DIAGNOSIS — I471 Supraventricular tachycardia: Secondary | ICD-10-CM

## 2021-08-31 DIAGNOSIS — I10 Essential (primary) hypertension: Secondary | ICD-10-CM

## 2021-08-31 DIAGNOSIS — E7800 Pure hypercholesterolemia, unspecified: Secondary | ICD-10-CM

## 2021-08-31 MED ORDER — LIDOCAINE (PF) 10 MG/ML (1 %) IJ SOLN
.2 mL | INTRAMUSCULAR | 0 refills | PRN
Start: 2021-08-31 — End: ?

## 2021-08-31 MED ORDER — CEFAZOLIN INJ 1GM IVP
2 g | Freq: Once | INTRAVENOUS | 0 refills
Start: 2021-08-31 — End: ?

## 2021-08-31 MED ORDER — SODIUM CHLORIDE 0.9 % IV SOLP
INTRAVENOUS | 0 refills
Start: 2021-08-31 — End: ?

## 2021-08-31 NOTE — Patient Instructions
ELECTROPHYSIOLOGY PRE-ADMISSION INSTRUCTIONS    Patient Name: Stephanie Morse  MRN#: [id number]  Date of Birth: 04/24/1950 (71 y.o.)  Today's Date: 08/31/2021    PROCEDURE:  You are scheduled for a Generator Change with Dr. Christain [place].Pimentel.      ARRIVAL TIME:  Please report to the [facility] admitting office on the ground floor of the [facility] on: Friday, 8/18    The [facility] is located at [address] [place], [place] [address]. Park in [employer] and enter through the  Main front doors to the hospital. The Heart Center is located on the right-hand side as soon as you walk in.    The EP Lab will call to notify you of your arrival time.  They will call on the business day prior to your procedure.  (If you have any questions regarding your arrival time for the Electrophysiology Lab, please call the EP Lab at [phone].)      PRE-PROCEDURE APPOINTMENTS:  8/3 at 8:30 am Office visit to update history and physical (requirement within 30 days of procedure)  with  Dr. Avie Arenas  at [facility]  [facility]     On or after 8/4 Pre-Admission lab work: BMP, CBC, and Magnesium  at [facility] in [place]. Labs have been faxed.  These labs need to be drawn within 2 weeks of the procedure, on or after August 4th.          SPECIAL MEDICATION INSTRUCTIONS  Nothing to eat or drink after midnight before your procedure.  Take your prescription medications with a sip of water as instructed.  No caffeine for 24 hours prior to your procedure.        Any new prescriptions will be sent to your pharmacy listed on file with [place].     HOLD ALL over the counter vitamins or supplements on the morning of your procedure.    Diuretics: Aldactone (spironolactone) -- hold the morning of your procedure.          Additional Instructions  If you wear CPAP, please bring your mask and machine with you to the hospital.    Take a bath or shower with anti-bacterial soap the evening before, or the morning of the procedure. We will give this to you at your office visit.     Bring photo ID and your health insurance card(s).    Arrange for a driver to take you home from the hospital.    Bring an accurate list of your current medications with you to the hospital (all meds and supplements taken daily).    Wear comfortable clothes and don't bring valuables, other than photo identification card, with you to the hospital.    Please pack a bag for an overnight stay.     Please review your pre-procedure instructions and call the office at [phone] with any questions. You may ask to speak with any of Dr. Prudencio Burly C.Pimentel's nurses. There are several of [place] in the office that can assist you. For questions regarding post procedure care or restrictions please refer to the Your Care Instructions included in the  a Generator Change Packet.     ALLERGIES  Allergies   Allergen Reactions    Protonix [Pantoprazole] UNKNOWN       CURRENT MEDICATIONS  Outpatient Encounter Medications as of 08/31/2021   Medication Sig Dispense Refill    acetaminophen (TYLENOL EXTRA STRENGTH) 500 mg tablet Take one tablet by mouth every  4 hours as needed for Pain. Max of 4,000 mg of acetaminophen in 24 hours.      candesartan (ATACAND) 4 mg tablet Take one tablet by mouth daily. Indications: chronic heart failure 90 tablet 3    carvediloL (COREG) 25 mg tablet TAKE 1 AND 1/2 TABLETS TWICE DAILY WITH MEALS (Patient taking differently: Take one tablet by mouth twice daily.) 270 tablet 3    cetirizine (ZYRTEC) 10 mg tablet Take one tablet by mouth daily.      hydrOXYchloroQUINE (PLAQUENIL) 200 mg tablet Take one tablet by mouth daily.      simvastatin (ZOCOR) 20 mg tablet Take one tablet by mouth at bedtime daily. Indications: high cholesterol 90 tablet 3    spironolactone (ALDACTONE) 25 mg tablet Take one tablet by mouth daily. Take with food.  Indications: heart failure with reduced ejection fraction 90 tablet 3     No facility-administered encounter medications on file as of 08/31/2021.     _________________________________________  Form completed by: Darlen Round, RN  Date completed: 08/31/21  Method: Via telephone and mailed to the patient.

## 2021-09-06 ENCOUNTER — Encounter: Admit: 2021-09-06 | Discharge: 2021-09-06 | Payer: MEDICARE

## 2021-09-06 NOTE — Progress Notes
7-19 [employer] for ICD Gen/Change [id number] per Daryel Gerald.  AUTH# [id number] valid from 7-19 to 01-04-2022    [id number] Cardioverter-defibrillator, other than s more  Cardioverter-defibrillator, other than single or dual chamber (implantable)   Notification/Prior Authorization not required for this service.      confirmed benefits and eligibility:  Current and active since 02-19-21, $ 0 deductible with required   co-insurance of 0% to max OOP $ [phone], then plan will pay 100% of allowable charges.

## 2021-09-20 ENCOUNTER — Encounter: Admit: 2021-09-20 | Discharge: 2021-09-20 | Payer: MEDICARE

## 2021-09-20 DIAGNOSIS — E7800 Pure hypercholesterolemia, unspecified: Secondary | ICD-10-CM

## 2021-09-20 DIAGNOSIS — I471 Supraventricular tachycardia: Secondary | ICD-10-CM

## 2021-09-20 DIAGNOSIS — I10 Essential (primary) hypertension: Secondary | ICD-10-CM

## 2021-09-20 DIAGNOSIS — I428 Other cardiomyopathies: Secondary | ICD-10-CM

## 2021-09-20 DIAGNOSIS — Z95810 Presence of automatic (implantable) cardiac defibrillator: Secondary | ICD-10-CM

## 2021-09-20 LAB — CBC
HEMATOCRIT: 39
HEMOGLOBIN: 12
MCH: 32
MCHC: 32
MCV: 99 — ABNORMAL HIGH
PLATELET COUNT: 154 — ABNORMAL LOW
RBC COUNT: 3.9
RDW: 11
WBC COUNT: 5.5

## 2021-09-20 LAB — BASIC METABOLIC PANEL
BLD UREA NITROGEN: 17
CALCIUM: 9.1
CHLORIDE: 109 — ABNORMAL HIGH
CO2: 20 — ABNORMAL LOW
CREATININE: 1.5 — ABNORMAL HIGH
GFR ESTIMATED: 49 — ABNORMAL LOW
GLUCOSE,PANEL: 104
SODIUM: 138

## 2021-09-20 LAB — MAGNESIUM: MAGNESIUM: 1.9 K/UL (ref 1.8–7.0)

## 2021-09-21 ENCOUNTER — Encounter: Admit: 2021-09-21 | Discharge: 2021-09-21 | Payer: MEDICARE

## 2021-09-21 DIAGNOSIS — M359 Systemic involvement of connective tissue, unspecified: Secondary | ICD-10-CM

## 2021-09-21 DIAGNOSIS — I509 Heart failure, unspecified: Secondary | ICD-10-CM

## 2021-09-21 DIAGNOSIS — Z95810 Presence of automatic (implantable) cardiac defibrillator: Secondary | ICD-10-CM

## 2021-09-21 DIAGNOSIS — E041 Nontoxic single thyroid nodule: Secondary | ICD-10-CM

## 2021-09-21 DIAGNOSIS — I472 VT (ventricular tachycardia) (HCC): Secondary | ICD-10-CM

## 2021-09-21 DIAGNOSIS — Z136 Encounter for screening for cardiovascular disorders: Secondary | ICD-10-CM

## 2021-09-21 DIAGNOSIS — I428 Other cardiomyopathies: Secondary | ICD-10-CM

## 2021-09-21 DIAGNOSIS — G500 Trigeminal neuralgia: Secondary | ICD-10-CM

## 2021-09-21 DIAGNOSIS — R058 Cough due to ACE inhibitor: Secondary | ICD-10-CM

## 2021-09-21 DIAGNOSIS — I10 Essential (primary) hypertension: Secondary | ICD-10-CM

## 2021-09-21 DIAGNOSIS — I429 Cardiomyopathy, unspecified: Secondary | ICD-10-CM

## 2021-09-21 DIAGNOSIS — E785 Hyperlipidemia, unspecified: Secondary | ICD-10-CM

## 2021-09-21 DIAGNOSIS — I471 Supraventricular tachycardia: Secondary | ICD-10-CM

## 2021-09-21 DIAGNOSIS — M609 Myositis, unspecified: Secondary | ICD-10-CM

## 2021-09-21 DIAGNOSIS — E7800 Pure hypercholesterolemia, unspecified: Secondary | ICD-10-CM

## 2021-09-21 NOTE — Progress Notes
Date of Service: 09/21/2021    Stephanie Morse is a 71 y.o. female.       HPI      Stephanie Morse is a 89 y.o. 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    Patient is doing well from a cardiac standpoint.  Her blood pressure and heart rate are well controlled.  She continues to tolerate her medications very well.    Patient was evaluated with a 2D echo Doppler study on 05/23/2021: LVEF 40-45%, normal right ventricular size and systolic function, mild global left ventricular hypokinesis, mild to moderate mitral and tricuspid valve regurgitation, PAP = 29 mmHg.       Vitals:    09/21/21 0831   BP: 110/72   BP Source: Arm, Left Upper   Pulse: 84   SpO2: 98%   O2 Device: None (Room air)   PainSc: Zero   Weight: 78.7 kg (173 lb 6.4 oz)   Height: 162.6 cm (5' 4)     Body mass index is 29.76 kg/m?Marland Kitchen     Past Medical History  Patient Active Problem List    Diagnosis Date Noted   ? 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 Cough due to ACE inhibitor 11/30/2014     11/1114 reports dry cough after taking morning meds, Lisinopril 20 DC'd losartan 50 begun     ? Atrial tachycardia [facility]) 04/26/2010     Feb 2012 ICD shock in FVT zone for 1:1 conduction  03/2310 Mag slighly low at 1.7, 400 mg supplement begun but triggers nausea.      ? VT (ventricular tachycardia) (HCC) 01/03/2010   ? Thyroid nodule 10/27/2008     benign     ? Trigeminal neuralgia 10/27/2008   ? 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  11/06 DOE, orthop, PND onset w/ progression.   b.  02/20/05 admit [facility] acute CHF, non sust'd VT, LBBB BNP 931>>02/21/05 Transfer [facility], Echo EF 15%, LV 7.6, LA 4.9, Mod MR.   c.  1/07 [facility] [place]: Normal coronaries, EF 20%, Coreg, ACE titration, 02/26/05 Implant Medtronic CRT-D.   d.  07/09/05 Echo EF 25-30% LV 6.2  e.  12/07 Echo EF 40%, LVEDD 5.2 PAP <30  f.   4/08 EF 45% LV 5.5  G. 2/10 Echo EF 45%, Valves OK mild MR  H. 08/18/09 Regaden Thall EF 51%, LV volume 110, Normal perfusion.   I   12/08/12-2D echo- decreased LV contractility from 04/06/2008  04/2013: Echo Doppler: LV EF 40% PAP= 43 Mild to mod Concentric LVH     ? 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  Positive anti-nuclear antibody,RNP     ? Hypertension 07/03/2008     a. 2004 Rx Altace, HCTZ Dr. Reather Littler     ? 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 02/26/05 CRT-D implant Dr. Bradly Bienenstock d/t Non sust'd VT w/ acute CHF . Fidelis; LV [id number] Lead, EF and QOL improve.  b. 02/13/08: VT treated w/ burst pacing x1, resolved w/ Rx, Pt had sx tachypalpitations  C  01/20/10  CRT-D Generator change, Fidelis  Lead removed,  LV lead repair at angulation  point near generator, DFT testing: VT terminates w 20 J shock but not 15.(LDB)  D. 12/24/14 CRT-D Generator change. Medtronic CRT-D VIVA XT CRTD DTBA1D1     ? Hyperlipemia 07/03/2008     07/23/11 total 129, trig 57, HDL 39 LDL 79 on simvastatin 20           Review of Systems   Constitutional: Negative.   HENT: Negative.    Eyes: Negative.    Cardiovascular: Negative.    Respiratory: Negative.    Endocrine: Negative.    Hematologic/Lymphatic: Negative.    Skin: Negative.    Musculoskeletal: Negative.    Gastrointestinal: Negative.    Genitourinary: Negative.    Neurological: Negative.    Psychiatric/Behavioral: Negative. Allergic/Immunologic: Negative.        Physical Exam  General Appearance: normal in appearance  Skin: warm, moist, no ulcers or xanthomas  Eyes: conjunctivae and lids normal, pupils are equal and round  Lips & Oral Mucosa: no pallor or cyanosis  Neck Veins: neck veins are flat, neck veins are not distended  Chest Inspection: Left-sided CRT-D  Respiratory Effort: breathing comfortably, no respiratory distress  Auscultation/Percussion: lungs clear to auscultation, no rales or rhonchi, no wheezing  Cardiac Rhythm: regular rhythm and normal rate  Cardiac Auscultation: S1, S2 normal, no rub, no gallop  Murmurs: no murmur  Carotid Arteries: normal carotid upstroke bilaterally, no bruit  Lower Extremity Edema: no lower extremity edema  Abdominal Exam: soft, non-tender, no masses, bowel sounds normal  Liver & Spleen: no organomegaly  Language and Memory: patient responsive and seems to comprehend information  Neurologic Exam: neurological assessment grossly intact      Cardiovascular Studies      Cardiovascular Health Factors  Vitals BP Readings from Last 3 Encounters:   09/21/21 110/72   05/23/21 100/75   05/23/21 112/72     Wt Readings from Last 3 Encounters:   09/21/21 78.7 kg (173 lb 6.4 oz)   05/23/21 82 kg (180 lb 12.4 oz)   05/23/21 79.8 kg (176 lb)     BMI Readings from Last 3 Encounters:   09/21/21 29.76 kg/m?   05/23/21 31.03 kg/m?   05/23/21 28.41 kg/m?      Smoking Social History     Tobacco Use   Smoking Status Never   Smokeless Tobacco Never      Lipid Profile Cholesterol   Date Value Ref Range Status   05/31/2020 110  Final     HDL   Date Value Ref Range Status   05/31/2020 38 (L) >=40 Final     LDL   Date Value Ref Range Status   05/31/2020 64  Final     Triglycerides   Date Value Ref Range Status   05/31/2020 41  Final      Blood Sugar Hemoglobin A1C   Date Value Ref Range Status   02/21/2005 6.2 5.0 - 6.5 % Final   02/21/2005  5.0 - 6.5 % Final    For non-diabetic patients, the normal reference range is 5.0-6.5%.  For patients with Type I or Type II Diabetes Mellitus, the ADA recommends  maintaining the A1c level <7.3%.  However,  at levels below 5.8%, the risk  of hypoglycemia increases.  Patients with an A1c of >9.8% are considered  to be extremely hyperglycemic.  Please note the reference range is higher by 0.3%.  This is due to the new  world standardizing organizations making diabetic monitoring the same  internationally.     Glucose   Date Value Ref Range Status   09/20/2021 104  Final   05/31/2020 105  Final   03/23/2019 99  Final   03/12/2005 109 70 - 110 MG/DL Final   16/11/9602 96 70 - 110 MG/DL Final   54/10/8117 147 (H) 70 - 110 MG/DL Final          Problems Addressed Today  Encounter Diagnoses   Name Primary?   ? Other cardiomyopathy (HCC) Yes   ? Primary hypertension    ? Pure hypercholesterolemia    ? VT (ventricular tachycardia) (HCC)    ? Atrial tachycardia (HCC)    ? Connective tissue disease, undifferentiated (HCC)    ? Biventricular implantable cardioverter-defibrillator in situ    ? Thyroid nodule    ? Trigeminal neuralgia    ? Cough due to ACE inhibitor    ? Screening for heart disease    ? Cardiomyopathy (HCC)    ? Hypertension    ? Hyperlipemia        Assessment and Plan        Assessment:  ?  1.  Heart failure reduced ejection fraction, idiopathic cardiomyopathy  ? Patient was diagnosed with this cardiomyopathy in January 2007, upon diagnosis LVEF = 15%  2.  Recovered systolic function, the most recent 2D echo Doppler study dated 05/23/2021 demonstrated LVEF = 40-45%, grade 1 diastolic dysfunction, PAP = 32 mmHg  3.  Status post LHC, no evidence of obstructive coronary artery disease-this was performed upon diagnosis of the cardiomyopathy  4.  Status post CRT-D therapy- last check dated 08/29/2021 demonstrated battery at end-of-life, patient is scheduled to undergo generator change on 10/06/2021  5.  Status post generator change x2 in December 2011 in November 2016  6.  History of ventricular tachycardia-these episodes occurred upon being diagnosed with the idiopathic cardiomyopathy  7.  ACE inhibitor induced cough-patient is currently on an ARB  8.  Hyperlipidemia-on statin therapy  9.  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  Primary hypertension-under good control  11.  Valvular disorder-the most recent echocardiogram dated 05/23/2021 demonstrated mild to moderate mitral and tricuspid valve regurgitation    Plan:    1.  Continue all current medications  2.  Patient will be called from [facility] the day before the procedure, she will receive further instructions regarding the CRT-D generator change scheduled for 10/06/2021  3.  Further follow-up in [place] office, patient could be scheduled on 10/12/2021 for a wound check, further follow-up with me in the office in November December 2023.    Total Time Today was 30 minutes in the following activities: Preparing to see the patient, Obtaining and/or reviewing separately obtained history, Performing a medically appropriate examination and/or evaluation, Counseling and educating the patient/family/caregiver, Ordering medications, tests, or procedures, Referring and communication with other health care professionals (when not separately reported), Documenting clinical information in the electronic or other health record, Independently interpreting results (not separately reported) and communicating results to the patient/family/caregiver and Care coordination (not separately reported)                 Current Medications (including today's revisions)  ?  acetaminophen (TYLENOL EXTRA STRENGTH) 500 mg tablet Take one tablet by mouth every 4 hours as needed for Pain. Max of 4,000 mg of acetaminophen in 24 hours.   ? candesartan (ATACAND) 4 mg tablet Take one tablet by mouth daily. Indications: chronic heart failure   ? carvediloL (COREG) 25 mg tablet TAKE 1 AND 1/2 TABLETS TWICE DAILY WITH MEALS (Patient taking differently: Take one tablet by mouth twice daily.)   ? cetirizine (ZYRTEC) 10 mg tablet Take one tablet by mouth daily.   ? hydrOXYchloroQUINE (PLAQUENIL) 200 mg tablet Take one tablet by mouth daily.   ? simvastatin (ZOCOR) 20 mg tablet Take one tablet by mouth at bedtime daily. Indications: high cholesterol   ? spironolactone (ALDACTONE) 25 mg tablet Take one tablet by mouth daily. Take with food.  Indications: heart failure with reduced ejection fraction

## 2021-09-28 ENCOUNTER — Encounter: Admit: 2021-09-28 | Discharge: 2021-09-28 | Payer: MEDICARE

## 2021-10-06 ENCOUNTER — Encounter: Admit: 2021-10-06 | Discharge: 2021-10-06 | Payer: MEDICARE

## 2021-10-06 DIAGNOSIS — M609 Myositis, unspecified: Secondary | ICD-10-CM

## 2021-10-06 DIAGNOSIS — G500 Trigeminal neuralgia: Secondary | ICD-10-CM

## 2021-10-06 DIAGNOSIS — I472 VT (ventricular tachycardia) (HCC): Secondary | ICD-10-CM

## 2021-10-06 DIAGNOSIS — I509 Heart failure, unspecified: Secondary | ICD-10-CM

## 2021-10-06 DIAGNOSIS — E041 Nontoxic single thyroid nodule: Secondary | ICD-10-CM

## 2021-10-06 DIAGNOSIS — E785 Hyperlipidemia, unspecified: Secondary | ICD-10-CM

## 2021-10-06 DIAGNOSIS — Z95810 Presence of automatic (implantable) cardiac defibrillator: Secondary | ICD-10-CM

## 2021-10-06 DIAGNOSIS — I10 Essential (primary) hypertension: Secondary | ICD-10-CM

## 2021-10-06 MED ORDER — HYDROCODONE-ACETAMINOPHEN 5-325 MG PO TAB
1 | ORAL_TABLET | ORAL | 0 refills | 30.00000 days | Status: AC | PRN
Start: 2021-10-06 — End: ?

## 2021-10-06 MED ADMIN — FENTANYL CITRATE (PF) 50 MCG/ML IJ SOLN [3037]: 50 ug | INTRAVENOUS | @ 13:00:00 | Stop: 2021-10-06 | NDC 00409909425

## 2021-10-06 MED ADMIN — MIDAZOLAM 1 MG/ML IJ SOLN [10607]: .5 mg | INTRAVENOUS | @ 13:00:00 | Stop: 2021-10-06 | NDC 00641605701

## 2021-10-06 MED ADMIN — FENTANYL CITRATE (PF) 50 MCG/ML IJ SOLN [3037]: 25 ug | INTRAVENOUS | @ 13:00:00 | Stop: 2021-10-06 | NDC 00409909425

## 2021-10-06 MED ADMIN — MIDAZOLAM 1 MG/ML IJ SOLN [10607]: .5 mg | INTRAVENOUS | @ 14:00:00 | Stop: 2021-10-06 | NDC 00641605701

## 2021-10-06 MED ADMIN — BUPIVACAINE (PF) 0.25 % (2.5 MG/ML) IJ SOLN [87866]: 10 mL | @ 13:00:00 | Stop: 2021-10-06 | NDC 00409155918

## 2021-10-06 MED ADMIN — LIDOCAINE (PF) 10 MG/ML (1 %) IJ SOLN [95838]: 10 mL | INTRAMUSCULAR | @ 13:00:00 | Stop: 2021-10-06 | NDC 55150016205

## 2021-10-06 MED ADMIN — SODIUM CHLORIDE 0.9 % IV SOLP [27838]: 1000.000 mL | INTRAVENOUS | @ 12:00:00 | Stop: 2021-10-06 | NDC 00338004904

## 2021-10-06 MED ADMIN — CEFAZOLIN 1 GRAM IJ SOLR [1445]: 500 mL | @ 13:00:00 | Stop: 2021-10-06 | NDC 00143992490

## 2021-10-06 MED ADMIN — CEFAZOLIN INJ 1GM IVP [210319]: 2 g | INTRAVENOUS | @ 13:00:00 | Stop: 2021-10-06 | NDC 00143992490

## 2021-10-06 MED ADMIN — MIDAZOLAM 1 MG/ML IJ SOLN [10607]: 1 mg | INTRAVENOUS | @ 13:00:00 | Stop: 2021-10-06 | NDC 00641605701

## 2021-10-06 MED ADMIN — ONDANSETRON HCL (PF) 4 MG/2 ML IJ SOLN [136012]: 4 mg | INTRAVENOUS | @ 15:00:00 | Stop: 2021-10-06 | NDC 00641607801

## 2021-10-06 MED ADMIN — SODIUM CHLORIDE 0.9 % IV SOLP [27838]: 500 mL | @ 13:00:00 | Stop: 2021-10-06 | NDC 00338004904

## 2021-10-10 ENCOUNTER — Encounter: Admit: 2021-10-10 | Discharge: 2021-10-10 | Payer: MEDICARE

## 2021-10-10 DIAGNOSIS — Z95810 Presence of automatic (implantable) cardiac defibrillator: Secondary | ICD-10-CM

## 2021-10-17 ENCOUNTER — Encounter: Admit: 2021-10-17 | Discharge: 2021-10-17 | Payer: MEDICARE

## 2021-10-17 NOTE — Progress Notes
Left prepectoral incision is clean, dry, well approximated, and healing without evidence of drainage or discharge. dermabond dry and intact. Pt reports no adverse symptoms. Incision care, including signs and symptoms of infection, reviewed(as below). Pt verbalized understanding and will remain in phone contact.  HAS PATIENT BEEN PROVIDED WITH REMOTE MONITORING AGREEMENT? []   You may shower once dressing removed at follow up appointment; however avoid direct contact with the incision (allow the water to hit the back of your shoulder rather than directly on the incision).    Do not submerge incision in tub, pool, hot tub, or lake for 4 weeks.    Unless your incision is bleeding or draining, keep it open to air.    Avoid applying deodorants, powders, creams, lotions, etc. to your incision for 4 weeks.    Usually there are no stitches to be removed. Derma bond glue will begin to fall off in 10-14 days.     Your incision should gradually look better each day. Please notify our office immediately if you notice any of the following:   -an increase in swelling or redness   -any drainage   -increasing pain at the incision site  -fever over 100 degrees  Instructions for New Post-Device Placement   May drive 2 days after procedure - Completed   Return to work/school in 3-5 days - Completed   No lifting >15 pounds and no sex for one week post procedure - Completed   You will need to continue to not life your arm above your should for 3 more weeks of the side the implant is on.   No strenuous activities for 6 weeks total.   No baseball/golf/tennis for 90 days.

## 2021-11-01 ENCOUNTER — Encounter: Admit: 2021-11-01 | Discharge: 2021-11-01 | Payer: MEDICARE

## 2021-11-01 NOTE — Telephone Encounter
Pt called about sending transmission.  It appears the monitor is plugged in.  I scheduled auto transmission for tomorrow and will check to see if it comes in.  LM for pt

## 2021-11-01 NOTE — Telephone Encounter
Patient was scheduled for a Medtronic Carelink on 10/26/21 that has not been received. Patient was instructed to send a manual transmission. Instructed if the transmitter does not appear to be working properly, they need to contact the device company directly. Patient was provided with that contact number. Requested the patient send [place] a MyChart message or contact our device nurses at [phone] to let us know after they have sent their transmission. LVM for pt to send in a transmission. BC      Note: Patient needs to send a manual transmission as we did not receive their scheduled remote interrogation.

## 2021-11-02 ENCOUNTER — Encounter: Admit: 2021-11-02 | Discharge: 2021-11-02 | Payer: MEDICARE

## 2021-12-21 ENCOUNTER — Encounter: Admit: 2021-12-21 | Discharge: 2021-12-21 | Payer: MEDICARE

## 2022-01-04 ENCOUNTER — Encounter: Admit: 2022-01-04 | Discharge: 2022-01-04 | Payer: MEDICARE

## 2022-01-04 ENCOUNTER — Ambulatory Visit: Admit: 2022-01-04 | Discharge: 2022-01-04 | Payer: MEDICARE

## 2022-01-04 DIAGNOSIS — Z4502 Encounter for adjustment and management of automatic implantable cardiac defibrillator: Secondary | ICD-10-CM

## 2022-01-04 DIAGNOSIS — I472 VT (ventricular tachycardia) (HCC): Secondary | ICD-10-CM

## 2022-02-06 ENCOUNTER — Encounter: Admit: 2022-02-06 | Discharge: 2022-02-06 | Payer: MEDICARE

## 2022-02-06 DIAGNOSIS — E041 Nontoxic single thyroid nodule: Secondary | ICD-10-CM

## 2022-02-06 DIAGNOSIS — G500 Trigeminal neuralgia: Secondary | ICD-10-CM

## 2022-02-06 DIAGNOSIS — E7800 Pure hypercholesterolemia, unspecified: Secondary | ICD-10-CM

## 2022-02-06 DIAGNOSIS — I10 Essential (primary) hypertension: Secondary | ICD-10-CM

## 2022-02-06 DIAGNOSIS — R058 Cough due to ACE inhibitor: Secondary | ICD-10-CM

## 2022-02-06 DIAGNOSIS — Z95810 Presence of automatic (implantable) cardiac defibrillator: Secondary | ICD-10-CM

## 2022-02-06 DIAGNOSIS — M359 Systemic involvement of connective tissue, unspecified: Secondary | ICD-10-CM

## 2022-02-06 DIAGNOSIS — I509 Heart failure, unspecified: Secondary | ICD-10-CM

## 2022-02-06 DIAGNOSIS — I472 VT (ventricular tachycardia) (HCC): Secondary | ICD-10-CM

## 2022-02-06 DIAGNOSIS — M609 Myositis, unspecified: Secondary | ICD-10-CM

## 2022-02-06 DIAGNOSIS — E785 Hyperlipidemia, unspecified: Secondary | ICD-10-CM

## 2022-02-06 DIAGNOSIS — I429 Cardiomyopathy, unspecified: Secondary | ICD-10-CM

## 2022-02-06 DIAGNOSIS — I4719 Atrial tachycardia: Secondary | ICD-10-CM

## 2022-02-06 NOTE — Progress Notes
Date of Service: 02/06/2022    Stephanie Morse is a 71 y.o. female.       HPI       Stephanie Morse is a 31 y.o. 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    Patient is doing well from a cardiac standpoint.  She does not have any symptoms of chest pain, no heart palpitations.  She was able to tolerate very well the procedure at [facility] performed in August.    She was evaluated with an echocardiogram in April 2023, this revealed LVEF = 40-45%, normal right ventricular size and function, global hypokinesis, mild to moderate MR and TR, PAP = 29 mmHg.       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  11/06 DOE, orthop, PND onset w/ progression.   b.  02/20/05 admit [facility] acute CHF, non sust'd VT, LBBB BNP 931>>02/21/05 Transfer [facility], Echo EF 15%, LV 7.6, LA 4.9, Mod MR.   c.  1/07 Cath [facility]: Normal coronaries, EF 20%, Coreg, ACE titration, 02/26/05 Implant Medtronic CRT-D.   d.  07/09/05 Echo EF 25-30% LV 6.2  e.  12/07 Echo EF 40%, LVEDD 5.2 PAP <30  f.   4/08 EF 45% LV 5.5  G. 2/10 Echo EF 45%, Valves OK mild MR  H. 08/18/09 Regaden Thall EF 51%, LV volume 110, Normal perfusion.   I   12/08/12-2D echo- decreased LV contractility from 04/06/2008  04/2013: Echo Doppler: LV EF 40% PAP= 43 Mild to mod Concentric LVH      Connective tissue disease, undifferentiated (HCC) 07/03/2008     MCTD/Rheumatoid arthritis          a.  Positive anti-nuclear antibody,RNP      Hypertension 07/03/2008     a. 2004 Rx Altace, HCTZ Dr. 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 02/26/05 CRT-D implant Dr. Bradly Bienenstock d/t Non sust'd VT  w/ acute CHF . Fidelis; LV [id number] Lead, EF and QOL improve.  b. 02/13/08: VT treated w/ burst pacing x1, resolved w/ Rx, Pt had sx tachypalpitations  C  01/20/10  CRT-D Generator change, Fidelis  Lead removed,  LV lead repair at angulation  point near generator, DFT testing: VT terminates w 20 J shock but not 15.(LDB)  D. 12/24/14 CRT-D Generator change. Medtronic CRT-D VIVA XT CRTD DTBA1D1      Hyperlipemia 07/03/2008     07/23/11 total 129, trig 57, HDL 39 LDL 79 on simvastatin 20           Review of Systems   Constitutional: Negative.   HENT: Negative.     Eyes: Negative.    Cardiovascular:  Positive for dyspnea on exertion.   Respiratory: Negative.     Endocrine: Negative.    Hematologic/Lymphatic: Negative.    Skin: Negative.    Musculoskeletal: Negative.    Gastrointestinal: Negative.    Genitourinary: Negative.    Neurological:  Positive for numbness and paresthesias.   Psychiatric/Behavioral: Negative.     Allergic/Immunologic: Negative.        Physical Exam    General Appearance: normal in appearance  Skin: warm, moist, no ulcers or xanthomas  Eyes: conjunctivae and lids normal, pupils are equal and round  Lips & Oral Mucosa: no pallor or cyanosis  Neck Veins: neck veins are flat, neck veins are not distended  Chest Inspection: chest is normal in appearance  Respiratory Effort: breathing comfortably, no respiratory distress  Auscultation/Percussion: lungs clear to auscultation, no rales or rhonchi, no wheezing  Cardiac Rhythm: regular rhythm and normal rate  Cardiac Auscultation: S1, S2 normal, no rub, no gallop  Murmurs: no murmur  Carotid Arteries: normal carotid upstroke bilaterally, no bruit  Lower Extremity Edema: no lower extremity edema  Abdominal Exam: soft, non-tender, no masses, bowel sounds normal  Liver & Spleen: no organomegaly  Language and Memory: patient responsive and seems to comprehend information  Neurologic Exam: neurological assessment grossly intact    Cardiovascular Studies      Cardiovascular Health Factors  Vitals BP Readings from Last 3 Encounters:   02/06/22 102/68   10/06/21 125/71   09/21/21 110/72     Wt Readings from Last 3 Encounters:   02/06/22 80.4 kg (177 lb 3.2 oz)   10/06/21 71.4 kg (157 lb 6.4 oz)   09/21/21 78.7 kg (173 lb 6.4 oz)     BMI Readings from Last 3 Encounters:   02/06/22 29.49 kg/m?   10/06/21 27.02 kg/m?   09/21/21 29.76 kg/m?      Smoking Social History     Tobacco Use   Smoking Status Never   Smokeless Tobacco Never      Lipid Profile Cholesterol   Date Value Ref Range Status   12/21/2021 131  Final     HDL   Date Value Ref Range Status   12/21/2021 40  Final     LDL   Date Value Ref Range Status   12/21/2021 81  Final     Triglycerides   Date Value Ref Range Status   12/21/2021 51  Final      Blood Sugar Hemoglobin A1C   Date Value Ref Range Status   02/21/2005 6.2 5.0 - 6.5 % Final   02/21/2005  5.0 - 6.5 % Final    For non-diabetic patients, the normal reference range is 5.0-6.5%.  For patients with Type I or Type II Diabetes Mellitus, the ADA recommends  maintaining the A1c level <7.3%.  However, at levels below 5.8%, the risk  of hypoglycemia increases.  Patients with an A1c of >9.8% are considered  to be extremely hyperglycemic.  Please note the reference range is higher by 0.3%.  This is due to the new  world standardizing organizations making diabetic monitoring the same  internationally.     Glucose   Date Value Ref Range Status   09/20/2021 104  Final   05/31/2020 105  Final   03/23/2019 99  Final   03/12/2005 109 70 - 110 MG/DL Final   16/11/9602 96 70 - 110 MG/DL Final   54/10/8117 147 (H) 70 - 110 MG/DL Final          Problems Addressed Today  Encounter Diagnoses   Name Primary?    Cardiomyopathy, unspecified type (HCC) Yes    Primary hypertension     Pure hypercholesterolemia     VT (ventricular tachycardia) (HCC)     Atrial tachycardia     Connective tissue disease, undifferentiated (HCC)     Biventricular implantable cardioverter-defibrillator in situ     Thyroid nodule     Trigeminal neuralgia     Cough due to ACE inhibitor        Assessment and Plan     Assessment:     1.  Heart failure reduced ejection fraction, idiopathic cardiomyopathy  Patient was diagnosed with this cardiomyopathy in January 2007, upon diagnosis LVEF = 15%  2.  Recovered systolic function, the most recent 2D echo Doppler study dated 05/23/2021 demonstrated LVEF = 40-45%, grade 1 diastolic dysfunction, PAP = 32 mmHg  3.  Status post LHC, no evidence of obstructive coronary artery disease-this was performed upon diagnosis of the cardiomyopathy  4.  Status post CRT-D therapy- last check dated 08/29/2021 demonstrated battery at end-of-life, patient is scheduled to undergo generator change on 10/06/2021  5.  Status post generator change x2 in December 2011 in November 2016 and August 2023  6.  History of ventricular tachycardia-these episodes occurred upon being diagnosed with the idiopathic cardiomyopathy  7.  ACE inhibitor induced cough-patient is currently on an ARB  8.  Hyperlipidemia-on statin therapy  9.  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  Primary hypertension-under good control  11.  Valvular disorder-the most recent echocardiogram dated 05/23/2021 demonstrated mild to moderate mitral and tricuspid valve regurgitation    Plan:    1.  Continue all current cardiac medications  2.  Further evaluation with a 2D echo Doppler study in March - April 2024  3.  Follow-up office visit with me in June - July 2024.    Total Time Today was 30 minutes in the following activities: Preparing to see the patient, Obtaining and/or reviewing separately obtained history, Performing a medically appropriate examination and/or evaluation, Counseling and educating the patient/family/caregiver, Ordering medications, tests, or procedures, Referring and communication with other health care professionals (when not separately reported), Documenting clinical information in the electronic or other health record, Independently interpreting results (not separately reported) and communicating results to the patient/family/caregiver, and Care coordination (not separately reported)            Current Medications (including today's revisions)   acetaminophen (TYLENOL EXTRA STRENGTH) 500 mg tablet Take one tablet by mouth every 4 hours as needed for Pain. Max of 4,000 mg of acetaminophen in 24 hours.    candesartan (ATACAND) 4 mg tablet Take one tablet by mouth daily. Indications: chronic heart failure  carvediloL (COREG) 25 mg tablet TAKE 1 AND 1/2 TABLETS TWICE DAILY WITH MEALS (Patient taking differently: Take one tablet by mouth twice daily.)    cetirizine (ZYRTEC) 10 mg tablet Take one tablet by mouth daily.    HYDROcodone/acetaminophen (NORCO) 5/325 mg tablet Take one tablet by mouth every 6 hours as needed for Pain.    hydrOXYchloroQUINE (PLAQUENIL) 200 mg tablet Take one tablet by mouth daily.    ibuprofen (ADVIL) 200 mg tablet Take one tablet by mouth as Needed for Pain. Take with food.    omeprazole DR (PRILOSEC) 20 mg capsule Take one capsule by mouth as Needed.    simvastatin (ZOCOR) 20 mg tablet Take one tablet by mouth at bedtime daily. Indications: high cholesterol    spironolactone (ALDACTONE) 25 mg tablet Take one tablet by mouth daily. Take with food.  Indications: heart failure with reduced ejection fraction

## 2022-02-14 ENCOUNTER — Encounter: Admit: 2022-02-14 | Discharge: 2022-02-14 | Payer: MEDICARE

## 2022-02-14 NOTE — Telephone Encounter
Patient was scheduled for a Medtronic Carelink on 02/08/22 that has not been received. Patient was instructed to send a manual transmission. Instructed if the transmitter does not appear to be working properly, they need to contact the device company directly. Patient was provided with that contact number. Requested the patient send us a MyChart message or contact our device nurses at [phone] to let us know after they have sent their transmission. LVM for pt to send in a transmission. BC      Note: Patient needs to send a manual transmission as we did not receive their scheduled remote interrogation.

## 2022-02-14 NOTE — Telephone Encounter
Pt called device line stating she has a new device and used the phone app not a monitor. Stated is should send on it's own.     Which yes, it should if open. Called back and LV < explaining to keep app open, tap it, go to messages and "send transmission" on top screen.     Notified HRM team via teams call was placed.

## 2022-03-02 ENCOUNTER — Encounter: Admit: 2022-03-02 | Discharge: 2022-03-02 | Payer: MEDICARE

## 2022-03-08 ENCOUNTER — Encounter: Admit: 2022-03-08 | Discharge: 2022-03-08 | Payer: MEDICARE

## 2022-03-16 ENCOUNTER — Encounter: Admit: 2022-03-16 | Discharge: 2022-03-16 | Payer: MEDICARE

## 2022-06-03 ENCOUNTER — Encounter: Admit: 2022-06-03 | Discharge: 2022-06-02 | Payer: MEDICARE

## 2022-06-12 ENCOUNTER — Encounter: Admit: 2022-06-12 | Discharge: 2022-06-12 | Payer: MEDICARE

## 2022-06-12 NOTE — Telephone Encounter
-----   Message from Cheri Guppy, RN sent at 06/02/2022  8:58 PM CDT -----  Regarding: Please review remote showing multiple SVT with rates into 200s therapies with held, with pt/provider team.  Please review the attached summary and IB/teams me if you have any questions.     Thank you,  Cheri Guppy

## 2022-07-23 ENCOUNTER — Encounter: Admit: 2022-07-23 | Discharge: 2022-07-23 | Payer: MEDICARE

## 2022-07-23 ENCOUNTER — Ambulatory Visit: Admit: 2022-07-23 | Discharge: 2022-07-23 | Payer: MEDICARE

## 2022-07-23 DIAGNOSIS — Z95810 Presence of automatic (implantable) cardiac defibrillator: Secondary | ICD-10-CM

## 2022-07-23 DIAGNOSIS — M359 Systemic involvement of connective tissue, unspecified: Secondary | ICD-10-CM

## 2022-07-23 DIAGNOSIS — R058 Cough due to ACE inhibitor: Secondary | ICD-10-CM

## 2022-07-23 DIAGNOSIS — I472 VT (ventricular tachycardia) (HCC): Secondary | ICD-10-CM

## 2022-07-23 DIAGNOSIS — G500 Trigeminal neuralgia: Secondary | ICD-10-CM

## 2022-07-23 DIAGNOSIS — I10 Essential (primary) hypertension: Secondary | ICD-10-CM

## 2022-07-23 DIAGNOSIS — I4719 Atrial tachycardia (HCC): Secondary | ICD-10-CM

## 2022-07-23 DIAGNOSIS — I429 Cardiomyopathy, unspecified: Secondary | ICD-10-CM

## 2022-07-23 DIAGNOSIS — E041 Nontoxic single thyroid nodule: Secondary | ICD-10-CM

## 2022-07-23 DIAGNOSIS — E7800 Pure hypercholesterolemia, unspecified: Secondary | ICD-10-CM

## 2022-08-06 ENCOUNTER — Encounter: Admit: 2022-08-06 | Discharge: 2022-08-06 | Payer: MEDICARE

## 2022-08-06 NOTE — Telephone Encounter
Sent mychart message with results and recommendations.  Pt has follow up scheduled.

## 2022-08-06 NOTE — Telephone Encounter
-----   Message from Selinda Flavin, MD sent at 08/03/2022  8:59 PM CDT -----  Please call this patient and let her know that the ejection fraction is in the range of 40 to 45%, it is the same as before, when the last echocardiogram performed in April 2023.    I think she is supposed to come back and see me in the office this summer.    Thank you  ----- Message -----  From: Jesse Sans, MD  Sent: 07/23/2022   1:47 PM CDT  To: Dorris Fetch, MD

## 2022-08-14 ENCOUNTER — Encounter: Admit: 2022-08-14 | Discharge: 2022-08-14 | Payer: MEDICARE

## 2022-08-14 DIAGNOSIS — I472 VT (ventricular tachycardia) (HCC): Secondary | ICD-10-CM

## 2022-08-14 DIAGNOSIS — I509 Heart failure, unspecified: Secondary | ICD-10-CM

## 2022-08-14 DIAGNOSIS — G500 Trigeminal neuralgia: Secondary | ICD-10-CM

## 2022-08-14 DIAGNOSIS — E785 Hyperlipidemia, unspecified: Secondary | ICD-10-CM

## 2022-08-14 DIAGNOSIS — M609 Myositis, unspecified: Secondary | ICD-10-CM

## 2022-08-14 DIAGNOSIS — E041 Nontoxic single thyroid nodule: Secondary | ICD-10-CM

## 2022-08-14 DIAGNOSIS — R058 Cough due to ACE inhibitor: Secondary | ICD-10-CM

## 2022-08-14 DIAGNOSIS — E7800 Pure hypercholesterolemia, unspecified: Secondary | ICD-10-CM

## 2022-08-14 DIAGNOSIS — I10 Essential (primary) hypertension: Secondary | ICD-10-CM

## 2022-08-14 DIAGNOSIS — I429 Cardiomyopathy, unspecified: Secondary | ICD-10-CM

## 2022-08-14 DIAGNOSIS — Z95810 Presence of automatic (implantable) cardiac defibrillator: Secondary | ICD-10-CM

## 2022-08-14 DIAGNOSIS — I110 Hypertensive heart disease with heart failure: Secondary | ICD-10-CM

## 2022-08-14 DIAGNOSIS — M359 Systemic involvement of connective tissue, unspecified: Secondary | ICD-10-CM

## 2022-08-14 DIAGNOSIS — I4719 Atrial tachycardia (HCC): Secondary | ICD-10-CM

## 2022-08-14 MED ORDER — ENTRESTO 24-26 MG PO TAB
1 | ORAL_TABLET | Freq: Two times a day (BID) | ORAL | 3 refills | Status: AC
Start: 2022-08-14 — End: ?

## 2022-08-14 NOTE — Patient Instructions
Thank you for visiting our office today.    We would like to make the following medication adjustments:  Check with pharmacy for cost of Entresto. If affordable stop taking spironolactone and atacand. Please let us know what you decide.       Otherwise continue the same medications as you have been doing.          We will be pursuing the following tests after your appointment today:       Orders Placed This Encounter    sacubitriL-valsartan (ENTRESTO) 24-26 mg tablet         We will plan to see you back in 3 months.  Please call us in the meantime with any questions or concerns.        Please allow 5-7 business days for our providers to review your results. All normal results will go to MyChart. If you do not have Mychart, it is strongly recommended to get this so you can easily view all your results. If you do not have mychart, we will attempt to call you once with normal lab and testing results. If we cannot reach you by phone with normal results, we will send you a letter.  If you have not heard the results of your testing after one week please give [place] a call.       Your Cardiovascular Medicine [place] [facility] Team Brett Canales, Pilar Jarvis and [place])  phone number is [phone].

## 2022-08-14 NOTE — Telephone Encounter
Price check for entresto 24/26 twice daily at patient's mail in pharmacy: $47/1 month and $131/3 month supply. Discussed this with the patient and she feels the price is acceptable and will switch to taking entresto instead of Atacand and spironolactone.     Prescription sent to [facility] for mail order and a 30 day sent to local [facility] for immediate pick up. Patient has call back number for any problems or concerns she may encounter.     Medication list updated.

## 2022-08-14 NOTE — Progress Notes
Date of Service: 08/14/2022    Stephanie Morse is a 72 y.o. female.       HPI         Stephanie Morse is a 46 y.o. 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    On 07/23/2022 patient was evaluated with an echocardiogram, it demonstrated LVEF = 40-45% with global hypokinesis, mild to moderate mitral valve regurgitation was present.  Next    The most recent device check dated 06/02/2022 demonstrated 1 episode of NSVT that lasted 3 seconds, it occurred on 04/15/2022, also brief episodes of SVT were present with a total burden of 0.04%, no therapy shock was delivered.    It was also noted that the patient did have an episode of atrial fibrillation that lasted 49 minutes on 04/29/2022, overall low burden and asymptomatic.    She continues to do well from a cardiac standpoint.  Today in the office she is borderline hypotensive, we did record BP = 94 over 67 mmHg, patient is asymptomatic.         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  11/06 DOE, orthop, PND onset w/ progression.   b.  02/20/05 admit [facility] acute CHF, non sust'd VT, LBBB BNP 931>>02/21/05 Transfer [facility], Echo EF 15%, LV 7.6, LA 4.9, Mod MR.   c.  1/07 Cath [place]: Normal coronaries, EF 20%, Coreg, ACE titration, 02/26/05 Implant Medtronic CRT-D.   d.  07/09/05 Echo EF 25-30% LV 6.2  e.  12/07 Echo EF 40%, LVEDD 5.2 PAP <30  f.   4/08 EF 45% LV 5.5  G. 2/10 Echo EF 45%, Valves OK mild MR  H. 08/18/09 Regaden Thall EF 51%, LV volume 110, Normal perfusion.   I   12/08/12-2D echo- decreased LV contractility from 04/06/2008  04/2013: Echo Doppler: LV EF 40% PAP= 43 Mild to mod Concentric LVH      Connective tissue disease, undifferentiated (  HCC) 07/03/2008     MCTD/Rheumatoid arthritis          a.  Positive anti-nuclear antibody,RNP      Hypertension 07/03/2008     a. 2004 Rx Altace, HCTZ Dr. 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 02/26/05 CRT-D implant Dr. Bradly Bienenstock d/t Non sust'd VT w/ acute CHF . Fidelis; LV [id number] Lead, EF and QOL improve.  b. 02/13/08: VT treated w/ burst pacing x1, resolved w/ Rx, Pt had sx tachypalpitations  C  01/20/10  CRT-D Generator change, Fidelis  Lead removed,  LV lead repair at angulation  point near generator, DFT testing: VT terminates w 20 J shock but not 15.(LDB)  D. 12/24/14 CRT-D Generator change. Medtronic CRT-D VIVA XT CRTD DTBA1D1      Hyperlipemia 07/03/2008     07/23/11 total 129, trig 57, HDL 39 LDL 79 on simvastatin 20           Review of Systems   Constitutional: Negative.   HENT: Negative.     Eyes: Negative.    Cardiovascular:  Positive for dyspnea on exertion. Respiratory: Negative.     Endocrine: Negative.    Hematologic/Lymphatic: Negative.    Skin: Negative.    Musculoskeletal: Negative.    Gastrointestinal: Negative.    Genitourinary: Negative.    Neurological: Negative.    Psychiatric/Behavioral: Negative.     Allergic/Immunologic: Negative.        Physical Exam  General Appearance: normal in appearance  Skin: warm, moist, no ulcers or xanthomas  Eyes: conjunctivae and lids normal, pupils are equal and round  Lips & Oral Mucosa: no pallor or cyanosis  Neck Veins: neck veins are flat, neck veins are not distended  Chest Inspection: chest is normal in appearance  Respiratory Effort: breathing comfortably, no respiratory distress  Auscultation/Percussion: lungs clear to auscultation, no rales or rhonchi, no wheezing  Cardiac Rhythm: regular rhythm and normal rate  Cardiac Auscultation: S1, S2 normal, no rub, no gallop  Murmurs: no murmur  Carotid Arteries: normal carotid upstroke bilaterally, no bruit  Lower Extremity Edema: no lower extremity edema  Abdominal Exam: soft, non-tender, no masses, bowel sounds normal  Liver & Spleen: no organomegaly  Language and Memory: patient responsive and seems to comprehend information  Neurologic Exam: neurological assessment grossly intact      Cardiovascular Studies      Cardiovascular Health Factors  Vitals BP Readings from Last 3 Encounters:   08/14/22 94/68   07/23/22 117/70   02/06/22 102/68     Wt Readings from Last 3 Encounters:   08/14/22 79.4 kg (175 lb)   07/23/22 79 kg (174 lb 3.2 oz)   02/06/22 80.4 kg (177 lb 3.2 oz)     BMI Readings from Last 3 Encounters:   08/14/22 29.12 kg/m?   07/23/22 28.99 kg/m?   02/06/22 29.49 kg/m?      Smoking Social History     Tobacco Use   Smoking Status Never   Smokeless Tobacco Never      Lipid Profile Cholesterol   Date Value Ref Range Status   12/21/2021 131  Final     HDL   Date Value Ref Range Status   12/21/2021 40  Final     LDL   Date Value Ref Range Status   12/21/2021 81 Final     Triglycerides   Date Value Ref Range Status   12/21/2021 51  Final  Blood Sugar Hemoglobin A1C   Date Value Ref Range Status   02/21/2005 6.2 5.0 - 6.5 % Final   02/21/2005  5.0 - 6.5 % Final    For non-diabetic patients, the normal reference range is 5.0-6.5%.  For patients with Type I or Type II Diabetes Mellitus, the ADA recommends  maintaining the A1c level <7.3%.  However, at levels below 5.8%, the risk  of hypoglycemia increases.  Patients with an A1c of >9.8% are considered  to be extremely hyperglycemic.  Please note the reference range is higher by 0.3%.  This is due to the new  world standardizing organizations making diabetic monitoring the same  internationally.     Glucose   Date Value Ref Range Status   09/20/2021 104  Final   05/31/2020 105  Final   03/23/2019 99  Final   03/12/2005 109 70 - 110 MG/DL Final   78/29/5621 96 70 - 110 MG/DL Final   30/86/5784 696 (H) 70 - 110 MG/DL Final          Problems Addressed Today  Encounter Diagnoses   Name Primary?    VT (ventricular tachycardia) (HCC) Yes    Cardiomyopathy, unspecified type (HCC)     Primary hypertension     Pure hypercholesterolemia     Atrial tachycardia (HCC)     Trigeminal neuralgia     Thyroid nodule     Cough due to ACE inhibitor     Connective tissue disease, undifferentiated (HCC)     Biventricular implantable cardioverter-defibrillator in situ        Assessment and Plan     Assessment:     1.  Heart failure reduced ejection fraction, idiopathic cardiomyopathy  Patient was diagnosed with this cardiomyopathy in January 2007, upon diagnosis LVEF = 15%  2.  Recovered systolic function, the most recent 2D echo Doppler study dated 07/24/2022 demonstrated LVEF = 40-45%, grade 1 diastolic dysfunction, PAP = 32 mmHg  3.  Status post LHC, no evidence of obstructive coronary artery disease-this was performed upon diagnosis of the cardiomyopathy  4.  Status post CRT-D therapy-status post generator change in October 2023  5.  Status post generator change x2 in December 2011 in November 2016 and August 2023  6.  History of ventricular tachycardia-these episodes occurred upon being diagnosed with the idiopathic cardiomyopathy  7.  ACE inhibitor induced cough-patient is currently on an ARB  8.  Hyperlipidemia-on statin therapy  9.  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  Primary hypertension-under good control  11.  Valvular disorder-the most recent echocardiogram dated 07/24/2022 demonstrated mild to moderate mitral and tricuspid valve regurgitation  12.  Abnormal ICD check dated 06/02/2022  NSVT occurred, it lasted 3 seconds on 04/15/2022  Brief episodes of SVT, low burden 0.04%  One episode of atrial fibrillation on 04/29/2022 it lasted 49 minutes-patient asymptomatic    Plan:    1.  I suggested the following:  Discontinue spironolactone and candesartan  Continue carvedilol at the present dose  We will issue a prescription for sacubitril/valsartan 24/26 mg 1 tablet p.o. daily  Patient will take this prescription to [facility], it will be run with her insurance, if the cost is available we will proceed as outlined above.      Total Time Today was 40 minutes in the following activities: Preparing to see the patient, Obtaining and/or reviewing separately obtained history, Performing a medically appropriate examination and/or evaluation, Counseling and educating the patient/family/caregiver, Ordering medications,  tests, or procedures, Referring and communication with other health care professionals (when not separately reported), Documenting clinical information in the electronic or other health record, Independently interpreting results (not separately reported) and communicating results to the patient/family/caregiver, and Care coordination (not separately reported)          Current Medications (including today's revisions)   acetaminophen (TYLENOL EXTRA STRENGTH) 500 mg tablet Take one tablet by mouth every 4 hours as needed for Pain. Max of 4,000 mg of acetaminophen in 24 hours.    candesartan (ATACAND) 4 mg tablet Take one tablet by mouth daily. Indications: chronic heart failure    carvediloL (COREG) 25 mg tablet TAKE 1 AND 1/2 TABLETS TWICE DAILY WITH MEALS (Patient taking differently: Take one tablet by mouth twice daily.)    cetirizine (ZYRTEC) 10 mg tablet Take one tablet by mouth daily.    HYDROcodone/acetaminophen (NORCO) 5/325 mg tablet Take one tablet by mouth every 6 hours as needed for Pain.    hydrOXYchloroQUINE (PLAQUENIL) 200 mg tablet Take one tablet by mouth daily.    ibuprofen (ADVIL) 200 mg tablet Take one tablet by mouth as Needed for Pain. Take with food.    omeprazole DR (PRILOSEC) 20 mg capsule Take one capsule by mouth as Needed.    simvastatin (ZOCOR) 20 mg tablet Take one tablet by mouth at bedtime daily. Indications: high cholesterol    spironolactone (ALDACTONE) 25 mg tablet Take one tablet by mouth daily. Take with food.  Indications: heart failure with reduced ejection fraction

## 2022-09-07 ENCOUNTER — Encounter: Admit: 2022-09-07 | Discharge: 2022-09-07 | Payer: MEDICARE

## 2022-10-11 ENCOUNTER — Encounter: Admit: 2022-10-11 | Discharge: 2022-10-11 | Payer: MEDICARE

## 2022-10-11 DIAGNOSIS — Z95810 Presence of automatic (implantable) cardiac defibrillator: Secondary | ICD-10-CM

## 2022-11-27 ENCOUNTER — Encounter: Admit: 2022-11-27 | Discharge: 2022-11-27 | Payer: MEDICARE

## 2022-11-27 DIAGNOSIS — M609 Myositis, unspecified: Secondary | ICD-10-CM

## 2022-11-27 DIAGNOSIS — Z95810 Presence of automatic (implantable) cardiac defibrillator: Secondary | ICD-10-CM

## 2022-11-27 DIAGNOSIS — I4719 Atrial tachycardia (HCC): Secondary | ICD-10-CM

## 2022-11-27 DIAGNOSIS — E785 Hyperlipidemia, unspecified: Secondary | ICD-10-CM

## 2022-11-27 DIAGNOSIS — I472 VT (ventricular tachycardia) (HCC): Secondary | ICD-10-CM

## 2022-11-27 DIAGNOSIS — G500 Trigeminal neuralgia: Secondary | ICD-10-CM

## 2022-11-27 DIAGNOSIS — I110 Hypertensive heart disease with heart failure: Secondary | ICD-10-CM

## 2022-11-27 DIAGNOSIS — I429 Cardiomyopathy, unspecified: Secondary | ICD-10-CM

## 2022-11-27 DIAGNOSIS — I10 Essential (primary) hypertension: Secondary | ICD-10-CM

## 2022-11-27 DIAGNOSIS — E041 Nontoxic single thyroid nodule: Secondary | ICD-10-CM

## 2022-11-27 DIAGNOSIS — R058 Cough due to ACE inhibitor: Secondary | ICD-10-CM

## 2022-11-27 DIAGNOSIS — E7800 Pure hypercholesterolemia, unspecified: Secondary | ICD-10-CM

## 2022-11-27 DIAGNOSIS — M359 Systemic involvement of connective tissue, unspecified: Secondary | ICD-10-CM

## 2022-11-27 DIAGNOSIS — I509 Heart failure, unspecified: Secondary | ICD-10-CM

## 2022-11-27 MED ORDER — ENTRESTO 24-26 MG PO TAB
1 | ORAL_TABLET | Freq: Two times a day (BID) | ORAL | 3 refills | Status: AC
Start: 2022-11-27 — End: ?

## 2022-11-27 NOTE — Patient Instructions
Stephanie Morse  Follow up in [place] after Stephanie  Call Scheduling [phone]  Follow up as directed.  Call sooner if issues.  Call the [place] nursing line at [phone].  Leave a detailed message for the nurse in [place] with how we can assist you and we will call you back.

## 2022-11-27 NOTE — Progress Notes
Date of Service: 11/27/2022    Stephanie Morse is a 72 y.o. female.       HPI      Stephanie Morse is a 34 y.o.  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     On a device check dated 04/29/2022, patient did have an episode of atrial fibrillation that lasted 49 minutes, she was asymptomatic.    The most recent device check dated 09/07/2022 demonstrated episodes of SVT/atrial tachycardia with a total burden of 0.01%, patient has remained asymptomatic with these episodes.    At the last office visit in June 2024, she was initiated on Entresto, I did discontinue at the time the spironolactone and the candesartan.    She is currently on sacubitril/valsartan 24/26 mg 1 tablet p.o. twice daily.  Her blood pressure and heart rate are under good control.  She has continued on carvedilol.  She has been able to tolerate this medication very well and has not experienced any side effects.         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  11/06 DOE, orthop, PND onset w/ progression.   b.  02/20/05 admit [facility] acute CHF, non sust'd VT, LBBB BNP 931>>02/21/05 Transfer [facility], Echo EF 15%, LV 7.6, LA 4.9, Mod MR.   c.  1/07 Cath [place]: Normal coronaries, EF 20%, Coreg, ACE titration, 02/26/05 Implant Medtronic CRT-D.   d.  07/09/05 Echo EF 25-30% LV 6.2  e.  12/07 Echo EF 40%, LVEDD 5.2 PAP <30  f.   4/08 EF 45% LV 5.5  G. 2/10 Echo EF 45%, Valves OK mild MR  H. 08/18/09 Regaden Thall EF 51%, LV volume 110, Normal perfusion.   I   12/08/12-2D echo- decreased LV contractility from 04/06/2008  04/2013: Echo Doppler: LV EF 40% PAP= 43 Mild to mod Concentric LVH      Connective tissue disease, undifferentiated (HCC) 07/03/2008  MCTD/Rheumatoid arthritis          a.  Positive anti-nuclear antibody,RNP      Hypertension 07/03/2008     a. 2004 Rx Altace, HCTZ Dr. 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 02/26/05 CRT-D implant Dr. Bradly Bienenstock d/t Non sust'd VT w/ acute CHF . Fidelis; LV [id number] Lead, EF and QOL improve.  b. 02/13/08: VT treated w/ burst pacing x1, resolved w/ Rx, Pt had sx tachypalpitations  C  01/20/10  CRT-D Generator change, Fidelis  Lead removed,  LV lead repair at angulation  point near generator, DFT testing: VT terminates w 20 J shock but not 15.(LDB)  D. 12/24/14 CRT-D Generator change. Medtronic CRT-D VIVA XT CRTD DTBA1D1      Hyperlipemia 07/03/2008     07/23/11 total 129, trig 57, HDL 39 LDL 79 on simvastatin 20           Review of Systems   Constitutional: Negative.   HENT: Negative.     Eyes: Negative.    Cardiovascular: Negative. Respiratory: Negative.     Endocrine: Negative.    Hematologic/Lymphatic: Negative.    Skin: Negative.    Musculoskeletal: Negative.    Gastrointestinal: Negative.    Genitourinary: Negative.    Neurological: Negative.    Psychiatric/Behavioral: Negative.     Allergic/Immunologic: Negative.        Physical Exam  General Appearance: normal in appearance  Skin: warm, moist, no ulcers or xanthomas  Eyes: conjunctivae and lids normal, pupils are equal and round  Lips & Oral Mucosa: no pallor or cyanosis  Neck Veins: neck veins are flat, neck veins are not distended  Chest Inspection: chest is normal in appearance  Respiratory Effort: breathing comfortably, no respiratory distress  Auscultation/Percussion: lungs clear to auscultation, no rales or rhonchi, no wheezing  Cardiac Rhythm: regular rhythm and normal rate  Cardiac Auscultation: S1, S2 normal, no rub, no gallop  Murmurs: no murmur  Carotid Arteries: normal carotid upstroke bilaterally, no bruit  Lower Extremity Edema: no lower extremity edema  Abdominal Exam: soft, non-tender, no masses, bowel sounds normal  Liver & Spleen: no organomegaly  Language and Memory: patient responsive and seems to comprehend information  Neurologic Exam: neurological assessment grossly intact      Cardiovascular Studies      Cardiovascular Health Factors  Vitals BP Readings from Last 3 Encounters:   11/27/22 102/78   08/14/22 94/68   07/23/22 117/70     Wt Readings from Last 3 Encounters:   11/27/22 80.5 kg (177 lb 6.4 oz)   08/14/22 79.4 kg (175 lb)   07/23/22 79 kg (174 lb 3.2 oz)     BMI Readings from Last 3 Encounters:   11/27/22 29.52 kg/m?   08/14/22 29.12 kg/m?   07/23/22 28.99 kg/m?      Smoking Social History     Tobacco Use   Smoking Status Never   Smokeless Tobacco Never      Lipid Profile Cholesterol   Date Value Ref Range Status   12/21/2021 131  Final     HDL   Date Value Ref Range Status   12/21/2021 40  Final     LDL   Date Value Ref Range Status   12/21/2021 81 Final     Triglycerides   Date Value Ref Range Status   12/21/2021 51  Final      Blood Sugar Hemoglobin A1C   Date Value Ref Range  Status   02/21/2005 6.2 5.0 - 6.5 % Final   02/21/2005  5.0 - 6.5 % Final    For non-diabetic patients, the normal reference range is 5.0-6.5%.  For patients with Type I or Type II Diabetes Mellitus, the ADA recommends  maintaining the A1c level <7.3%.  However, at levels below 5.8%, the risk  of hypoglycemia increases.  Patients with an A1c of >9.8% are considered  to be extremely hyperglycemic.  Please note the reference range is higher by 0.3%.  This is due to the new  world standardizing organizations making diabetic monitoring the same  internationally.     Glucose   Date Value Ref Range Status   09/20/2021 104  Final   05/31/2020 105  Final   03/23/2019 99  Final   03/12/2005 109 70 - 110 MG/DL Final   42/59/5638 96 70 - 110 MG/DL Final   75/64/3329 518 (H) 70 - 110 MG/DL Final          Problems Addressed Today  Encounter Diagnoses   Name Primary?    VT (ventricular tachycardia) (HCC) Yes    Cardiomyopathy, unspecified type (HCC)     Hypertensive heart disease with heart failure (HCC)     Primary hypertension     Pure hypercholesterolemia     Atrial tachycardia (HCC)     Trigeminal neuralgia     Thyroid nodule     Cough due to ACE inhibitor     Connective tissue disease, undifferentiated (HCC)     Biventricular implantable cardioverter-defibrillator in situ        Assessment and Plan     Assessment:     1.  Heart failure reduced ejection fraction, idiopathic cardiomyopathy  Patient was diagnosed with this cardiomyopathy in January 2007, upon diagnosis LVEF = 15%  2.  Recovered systolic function, the most recent 2D echo Doppler study dated 07/24/2022 demonstrated LVEF = 40-45%, grade 1 diastolic dysfunction, PAP = 32 mmHg  3.  Status post LHC, no evidence of obstructive coronary artery disease-this was performed upon diagnosis of the cardiomyopathy  4.  Status post CRT-D therapy-status post generator change in October 2023  5.  Status post generator change x 2 in December 2011 in November 2016 and August 2023  6.  History of ventricular tachycardia-these episodes occurred upon being diagnosed with the idiopathic cardiomyopathy  7.  ACE inhibitor induced cough-patient is currently on sacubitril/valsartan, previously she was on candesartan alone  8.  Hyperlipidemia-on statin therapy  9.  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  Primary hypertension-under good control  11.  Valvular disorder-the most recent echocardiogram dated 07/24/2022 demonstrated mild to moderate mitral and tricuspid valve regurgitation  12.  Abnormal ICD check dated 06/02/2022  NSVT occurred, it lasted 3 seconds on 04/15/2022  Brief episodes of SVT, low burden 0.04%  One episode of atrial fibrillation on 04/29/2022 it lasted 49 minutes-patient asymptomatic    Plan:    1.  Continue sacubitril/valsartan 24 mg / 26 mg 1 tablet p.o. twice daily  2.  Strict low-sodium diet  3.  I do recommend weight reduction and regular physical activity  4.  Further evaluation with a 2D echo Doppler study and follow-up office visit with me in March - April 2025.      Total Time Today was 30 minutes in the following activities: Preparing to see the patient, Obtaining and/or reviewing separately obtained history, Performing a medically appropriate examination and/or evaluation, Counseling and educating the  patient/family/caregiver, Ordering medications, tests, or procedures, Referring and communication with other health care professionals (when not separately reported), Documenting clinical information in the electronic or other health record, Independently interpreting results (not separately reported) and communicating results to the patient/family/caregiver, and Care coordination (not separately reported)            Current Medications (including today's revisions)   acetaminophen (TYLENOL EXTRA STRENGTH) 500 mg tablet Take one tablet by mouth every 4 hours as needed for Pain. Max of 4,000 mg of acetaminophen in 24 hours.    carvediloL (COREG) 25 mg tablet TAKE 1 AND 1/2 TABLETS TWICE DAILY WITH MEALS (Patient taking differently: Take one tablet by mouth twice daily.)    cetirizine (ZYRTEC) 10 mg tablet Take one tablet by mouth daily.    fluticasone propionate (FLONASE) 50 mcg/actuation nasal spray, suspension USE 1 SPRAY(S) IN EACH NOSTRIL ONCE DAILY    HYDROcodone/acetaminophen (NORCO) 5/325 mg tablet Take one tablet by mouth every 6 hours as needed for Pain.    hydrOXYchloroQUINE (PLAQUENIL) 200 mg tablet Take one tablet by mouth daily.    ibuprofen (ADVIL) 200 mg tablet Take one tablet by mouth as Needed for Pain. Take with food.    omeprazole DR (PRILOSEC) 20 mg capsule Take one capsule by mouth as Needed.    sacubitriL-valsartan (ENTRESTO) 24-26 mg tablet Take one tablet by mouth twice daily.    sacubitriL-valsartan (ENTRESTO) 24-26 mg tablet Take one tablet by mouth twice daily.    simvastatin (ZOCOR) 20 mg tablet Take one tablet by mouth at bedtime daily. Indications: high cholesterol

## 2022-12-04 ENCOUNTER — Encounter: Admit: 2022-12-04 | Discharge: 2022-12-04 | Payer: MEDICARE

## 2023-01-14 ENCOUNTER — Encounter: Admit: 2023-01-14 | Discharge: 2023-01-14 | Payer: MEDICARE

## 2023-01-14 ENCOUNTER — Encounter: Admit: 2023-01-14 | Discharge: 2023-01-15

## 2023-01-24 ENCOUNTER — Encounter: Admit: 2023-01-24 | Discharge: 2023-01-25

## 2023-02-04 ENCOUNTER — Encounter: Admit: 2023-02-04 | Discharge: 2023-02-04 | Payer: MEDICARE

## 2023-02-04 NOTE — Telephone Encounter
-----   Message from [place] O sent at 02/04/2023  2:13 PM CST -----  Regarding: RE: Alert for low V pacing. Elevated optivol (fluid). - Would you ask Dr. Avie Arenas??  Continue to monitor  ----- Message -----  From: Rogelia Boga, RN  Sent: 01/31/2023   8:38 AM CST  To: Lauralee Evener, RN  Subject: Alert for low V pacing. Elevated optivol (fl#    Hello!     We got an alert on this patient last week that Dr. Avie Arenas reviewed and said to just continue to monitor.  We received this one a couple days later.  I finally spoke to the patient.  She denies any weight gain or swelling.  She does has a little shortness of breath, but states she has been busy travelling and putting up Christmas decorations.  She states that it improves when she sits and rests.      Would you mind asking Dr. Avie Arenas to review?  If not, no problem! Please let me know if she has any recommendations.     Thank you so much for your help!   Asher Muir       ----- Message -----   From: Gordy Savers   Sent: 01/24/2023   7:05 AM CST   To: [facility] Nurse [place]   Subject: Alert for low V pacing. Elevated optivol (fl#     A Yellow Alert was reported by the device:     Alert for low V pacing. V pacing <90% past 7 days. Overall V pacing 91.9% since 01/12/23 (90.6% effective).     Additional Notes:   Device function appears normal. Current rhythm AS-BVP 90s bpm. 5 SVT episodes, longest 27 seconds. Avg V rates 188 bpm. All on 01/23/23. Egms showed 1:1 rhythm gradual onset/offset. Appeared to be ST. V sensing episodes have increased and have averaged 72 minutes/day. Rates 133 to 171 bpm. Egms showed all 1:1 rhythm. Some appeared showed 1:1 AT (sudden onset) and others ST versus AT.     Difficult to discern cause of low V pacing. 1:1 AT/ST is contributing some. Last remote check PVCs were suspected, but PVC count remains low.     Heart failure diagnostics assessed through the device   Status: Elevated

## 2023-03-05 ENCOUNTER — Encounter: Admit: 2023-03-05 | Discharge: 2023-03-05 | Payer: MEDICARE

## 2023-03-08 ENCOUNTER — Encounter: Admit: 2023-03-08 | Discharge: 2023-03-08 | Payer: MEDICARE

## 2023-03-08 NOTE — Telephone Encounter
Called and discussed with patient.  She is feeling fine, no issues.  Will route to Dr. Avie Arenas for review and recommendations.

## 2023-03-08 NOTE — Telephone Encounter
-----   Message from [place] D sent at 03/05/2023 11:27 AM CST -----  Regarding: FW: [facility]: pt is cont to have SVT noted in her device. longest was 1 hour 6 min at 167 bpm on 02/27/23, pls follow up as needed, thanks    ----- Message -----  From: Sonia Baller, RN  Sent: 03/04/2023   1:35 PM CST  To: [facility] Nurse [place]  Subject: [facility]: pt is cont to have SVT noted in her dev#    [facility]: pt is cont to have SVT noted in her device. longest was 1 hour 6 min at 167 bpm on 02/27/23, pls follow up as needed, she has had in the past, bt not sure it they lasted that long? No HF indicated. When her HR gets > 110 or so, she has more ineffective Bi V pacing, which overall is improved since last session. Let me know if questions. Thx

## 2023-03-19 ENCOUNTER — Encounter: Admit: 2023-03-19 | Discharge: 2023-03-19 | Payer: MEDICARE

## 2023-03-27 ENCOUNTER — Encounter: Admit: 2023-03-27 | Discharge: 2023-03-27 | Payer: MEDICARE

## 2023-03-27 DIAGNOSIS — I429 Cardiomyopathy, unspecified: Secondary | ICD-10-CM

## 2023-03-27 DIAGNOSIS — Z95810 Presence of automatic (implantable) cardiac defibrillator: Secondary | ICD-10-CM

## 2023-03-27 DIAGNOSIS — I4719 Atrial tachycardia (HCC): Secondary | ICD-10-CM

## 2023-03-27 DIAGNOSIS — I472 VT (ventricular tachycardia) (HCC): Secondary | ICD-10-CM

## 2023-04-02 ENCOUNTER — Ambulatory Visit: Admit: 2023-04-02 | Discharge: 2023-04-03 | Payer: MEDICARE

## 2023-05-07 ENCOUNTER — Ambulatory Visit: Admit: 2023-05-07 | Discharge: 2023-05-08 | Payer: MEDICARE

## 2023-05-07 ENCOUNTER — Encounter: Admit: 2023-05-07 | Discharge: 2023-05-07 | Payer: MEDICARE

## 2023-05-07 DIAGNOSIS — Z95810 Presence of automatic (implantable) cardiac defibrillator: Secondary | ICD-10-CM

## 2023-05-07 DIAGNOSIS — I429 Cardiomyopathy, unspecified: Secondary | ICD-10-CM

## 2023-05-17 ENCOUNTER — Encounter: Admit: 2023-05-17 | Discharge: 2023-05-17

## 2023-05-17 ENCOUNTER — Encounter: Admit: 2023-05-17 | Discharge: 2023-05-18

## 2023-05-17 NOTE — Telephone Encounter
 Attempted to call pt to discuss, reached her voicemail. LM advising ER if significant symptoms over the weekend, otherwise asked her to followup with our office on Monday.

## 2023-05-17 NOTE — Telephone Encounter
-----   Message from [place] V sent at 05/17/2023  6:16 AM CDT -----  Regarding: Alert for low CRT pacing, frequent SVT([facility])  Good morning-     device recorded multiple SVT episodes on 05/16/23 with A/V rates as high as 207 bpm. Appropriately withholding treatment. V sensing episode AVG is 99 min/day with longest episode being 1.5 hours long. This is likely leading to low CRT pacing at this time.    A Yellow Alert was reported by the device:    CRT pacing <90%. Currently effective pacing is ~70% per cardiac compass trends. AVG since 05/07/23 is 89%.    Low pacing is likely related to SVT episodes causing V sensing. See separate impression.    HF diagnostics are stable    Presenting Rhythm: AS-BiVP 90's bpm    Please see full report in Epic for details and follow up as needed.    Thanks- Soil scientist / Device Team

## 2023-05-31 ENCOUNTER — Encounter: Admit: 2023-05-31 | Discharge: 2023-06-01 | Payer: MEDICARE

## 2023-06-03 ENCOUNTER — Encounter: Admit: 2023-06-03 | Discharge: 2023-06-03 | Payer: MEDICARE

## 2023-06-04 ENCOUNTER — Encounter: Admit: 2023-06-04 | Discharge: 2023-06-04 | Payer: MEDICARE

## 2023-06-04 NOTE — Telephone Encounter
-----   Message from [place] H sent at 06/03/2023  2:26 PM CDT -----  Regarding: FW: yellow alert    ----- Message -----  From: Evone Hoh, RN  Sent: 06/03/2023   8:38 AM CDT  To: [facility] Nurse [place]  Subject: yellow alert                                     A Yellow Alert was reported by the device:    Total VP less than 90% for 7 days  Current Effective pacing 77.7%  Presenting Rhythm AS-BV 70's bpm with PVC  Low pacing likely due to ST above max track rate    See full report and follow up as needed    [place]

## 2023-06-04 NOTE — Telephone Encounter
 Spoke with patient via phone. Patient denies any symptoms or concerns at this time.

## 2023-06-26 ENCOUNTER — Ambulatory Visit: Admit: 2023-06-26 | Discharge: 2023-06-26 | Payer: MEDICARE

## 2023-06-26 ENCOUNTER — Encounter: Admit: 2023-06-26 | Discharge: 2023-06-26 | Payer: MEDICARE

## 2023-07-02 ENCOUNTER — Encounter: Admit: 2023-07-02 | Discharge: 2023-07-02 | Payer: MEDICARE

## 2023-07-02 ENCOUNTER — Ambulatory Visit: Admit: 2023-07-02 | Discharge: 2023-07-03 | Payer: MEDICARE

## 2023-07-02 DIAGNOSIS — R058 Cough due to ACE inhibitor: Secondary | ICD-10-CM

## 2023-07-02 DIAGNOSIS — I4719 Atrial tachycardia: Secondary | ICD-10-CM

## 2023-07-02 DIAGNOSIS — I472 VT (ventricular tachycardia) (CMS-HCC): Secondary | ICD-10-CM

## 2023-07-02 DIAGNOSIS — Z95810 Presence of automatic (implantable) cardiac defibrillator: Secondary | ICD-10-CM

## 2023-07-02 DIAGNOSIS — I10 Essential (primary) hypertension: Secondary | ICD-10-CM

## 2023-07-02 DIAGNOSIS — I429 Cardiomyopathy, unspecified: Secondary | ICD-10-CM

## 2023-07-02 DIAGNOSIS — G500 Trigeminal neuralgia: Secondary | ICD-10-CM

## 2023-07-02 DIAGNOSIS — Z136 Encounter for screening for cardiovascular disorders: Secondary | ICD-10-CM

## 2023-07-02 DIAGNOSIS — I110 Hypertensive heart disease with heart failure: Secondary | ICD-10-CM

## 2023-07-02 DIAGNOSIS — E041 Nontoxic single thyroid nodule: Secondary | ICD-10-CM

## 2023-07-02 DIAGNOSIS — E7800 Pure hypercholesterolemia, unspecified: Secondary | ICD-10-CM

## 2023-07-02 DIAGNOSIS — M359 Systemic involvement of connective tissue, unspecified: Secondary | ICD-10-CM

## 2023-07-02 MED ORDER — CARVEDILOL 25 MG PO TAB
25 mg | ORAL_TABLET | Freq: Two times a day (BID) | ORAL | 3 refills | 90.00000 days | Status: AC
Start: 2023-07-02 — End: ?

## 2023-07-02 MED ORDER — ENTRESTO 24-26 MG PO TAB
.5 | ORAL_TABLET | Freq: Two times a day (BID) | ORAL | 3 refills | 30.00000 days | Status: AC
Start: 2023-07-02 — End: ?

## 2023-07-02 NOTE — Progress Notes
 Date of Service: 07/02/2023    Stephanie Morse is a 73 y.o. female.       HPI      Stephanie Morse is a 73 y.o. 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    She underwent evaluation with the following:  A 2D echo done prior study dated 06/26/2023 demonstrated LVEF = 45%, global hypokinesis, no significant valvular abnormalities, estimated PAP 25 mm + RAP.  Device interrogation dated 06/03/2023-a sensed biventricular pacing, episodes of atrial tachycardia vs. sinus tachycardia were present, average 3 hours a day, patient has been asymptomatic with these episodes.    Patient reports doing well from a cardiac standpoint.  At times she is slightly tired.  She is also the primary caregiver of her husband who has Parkinson disease.    She also reports experiencing left plantar pain, patient reports having a diagnosis of plantar fasciitis.       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  11/06 DOE, orthop, PND onset w/ progression.   b.  02/20/05 admit [facility] acute CHF, non sust'd VT, LBBB BNP 931>>02/21/05 Transfer [facility], Echo EF 15%, LV 7.6, LA 4.9, Mod MR.   c.  1/07 Cath [facility]: Normal coronaries, EF 20%, Coreg, ACE titration, 02/26/05 Implant Medtronic CRT-D.   d.  07/09/05 Echo EF 25-30% LV 6.2  e.  12/07 Echo EF 40%, LVEDD 5.2 PAP <30  f.   4/08 EF 45% LV 5.5  G. 2/10 Echo EF 45%, Valves OK mild MR  H. 08/18/09 Regaden Thall EF 51%, LV volume 110, Normal perfusion.   I   12/08/12-2D echo- decreased LV contractility from 04/06/2008  04/2013: Echo Doppler: LV EF 40% PAP= 43 Mild to mod Concentric LVH      Connective tissue disease, undifferentiated 07/03/2008     MCTD/Rheumatoid arthritis  a.  Positive anti-nuclear antibody,RNP      Hypertension 07/03/2008     a. 2004 Rx Altace, HCTZ Dr. 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 02/26/05 CRT-D implant Dr. Joaquim Muir d/t Non sust'd VT w/ acute CHF . Fidelis; LV [id number] Lead, EF and QOL improve.  b. 02/13/08: VT treated w/ burst pacing x1, resolved w/ Rx, Pt had sx tachypalpitations  C  01/20/10  CRT-D Generator change, Fidelis  Lead removed,  LV lead repair at angulation  point near generator, DFT testing: VT terminates w 20 J shock but not 15.(LDB)  D. 12/24/14 CRT-D Generator change. Medtronic CRT-D VIVA XT CRTD DTBA1D1      Hyperlipemia 07/03/2008     07/23/11 total 129, trig 57, HDL 39 LDL 79 on simvastatin 20           Review of Systems   Constitutional: Negative.   HENT: Negative.     Eyes: Negative.    Cardiovascular:  Positive for dyspnea on exertion. Respiratory: Negative.     Endocrine: Negative.    Hematologic/Lymphatic: Negative.    Skin: Negative.    Musculoskeletal:  Positive for joint pain and joint swelling.   Gastrointestinal: Negative.    Genitourinary: Negative.    Neurological: Negative.    Psychiatric/Behavioral: Negative.     Allergic/Immunologic: Negative.        Physical Exam  General Appearance: normal in appearance  Skin: warm, moist, no ulcers or xanthomas  Eyes: conjunctivae and lids normal, pupils are equal and round  Lips & Oral Mucosa: no pallor or cyanosis  Neck Veins: neck veins are flat, neck veins are not distended  Chest Inspection: chest is normal in appearance  Respiratory Effort: breathing comfortably, no respiratory distress  Auscultation/Percussion: lungs clear to auscultation, no rales or rhonchi, no wheezing  Cardiac Rhythm: regular rhythm and normal rate  Cardiac Auscultation: S1, S2 normal, no rub, no gallop  Murmurs: no murmur  Carotid Arteries: normal carotid upstroke bilaterally, no bruit  Lower Extremity Edema: no lower extremity edema  Abdominal Exam: soft, non-tender, no masses, bowel sounds normal  Liver & Spleen: no organomegaly  Language and Memory: patient responsive and seems to comprehend information  Neurologic Exam: neurological assessment grossly intact    Cardiovascular Studies  Twelve-lead EKG demonstrates ventricular pacing, ventricular rate 83 bpm.    Cardiovascular Health Factors  Vitals BP Readings from Last 3 Encounters:   07/02/23 92/78   06/26/23 113/84   11/27/22 102/78     Wt Readings from Last 3 Encounters:   07/02/23 79.2 kg (174 lb 9.6 oz)   06/26/23 78.8 kg (173 lb 12.8 oz)   11/27/22 80.5 kg (177 lb 6.4 oz)     BMI Readings from Last 3 Encounters:   07/02/23 29.05 kg/m?   06/26/23 28.92 kg/m?   11/27/22 29.52 kg/m?      Smoking Social History     Tobacco Use   Smoking Status Never   Smokeless Tobacco Never      Lipid Profile Cholesterol   Date Value Ref Range Status   01/23/2023 142  Final HDL   Date Value Ref Range Status   01/23/2023 39 (L) >=40 Final     LDL   Date Value Ref Range Status   01/23/2023 93  Final     Triglycerides   Date Value Ref Range Status   01/23/2023 54  Final      Blood Sugar Hemoglobin A1C  Date Value Ref Range Status   02/21/2005 6.2 5.0 - 6.5 % Final   02/21/2005  5.0 - 6.5 % Final    For non-diabetic patients, the normal reference range is 5.0-6.5%.  For patients with Type I or Type II Diabetes Mellitus, the ADA recommends  maintaining the A1c level <7.3%.  However, at levels below 5.8%, the risk  of hypoglycemia increases.  Patients with an A1c of >9.8% are considered  to be extremely hyperglycemic.  Please note the reference range is higher by 0.3%.  This is due to the new  world standardizing organizations making diabetic monitoring the same  internationally.     Glucose   Date Value Ref Range Status   01/23/2023 96  Final   09/20/2021 104  Final   05/31/2020 105  Final          Problems Addressed Today  Encounter Diagnoses   Name Primary?    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  Heart failure reduced ejection fraction, idiopathic cardiomyopathy  Patient was diagnosed with this cardiomyopathy in January 2007, upon diagnosis LVEF = 15%  2.  Recovered systolic function  The most recent echocardiogram that did 06/26/2023-LVEF = 45%, global hypokinesis  3.  Status post LHC, no evidence of obstructive coronary artery disease-this was performed upon diagnosis of the cardiomyopathy  4.  Status post CRT-D therapy-status post generator change in October 2023  5.  Status post generator change x 2 in December 2011 in November 2016 and August 2023  6.  History of ventricular tachycardia-these episodes occurred upon being diagnosed with the idiopathic cardiomyopathy  7.  ACE inhibitor induced cough-patient is currently on sacubitril/valsartan, previously she was on candesartan alone  8.  Hyperlipidemia-on statin therapy  9.  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  Primary hypertension-under good control  11.  Valvular disorder-the most recent echocardiogram dated 07/24/2022 demonstrated mild to moderate mitral and tricuspid valve regurgitation  12.  Abnormal ICD check   NSVT occurred, it lasted 3 seconds on 04/15/2022  Brief episodes of SVT, low burden 0.04%  One episode of atrial fibrillation on 04/29/2022 it lasted 49 minutes-patient asymptomatic  The most recent device check dated 06/03/2023 demonstrated episodes of atrial tachycardiavs.  Sinus tachycardia, overall low burden    Plan:    1.  Due to relative hypotension I asked the patient to decrease the sacubitril-valsartan 24-26 mg to half tablet p.o. twice daily, she will continue carvedilol 25 mg 1 tablet p.o. twice daily  2.  Continue all other medications  3.  Follow-up office visit in 6 months.    Total Time Today was 30 minutes in the following activities: Preparing to see the patient, Obtaining and/or reviewing separately obtained history, Performing a medically appropriate examination and/or evaluation, Counseling and educating the patient/family/caregiver, Ordering medications, tests, or procedures, Referring and communication with other health care professionals (when  not separately reported), Documenting clinical information in the electronic or other health record, Independently interpreting results (not separately reported) and communicating results to the patient/family/caregiver, and Care coordination (not separately reported)             Current Medications (including today's revisions)   acetaminophen (TYLENOL EXTRA STRENGTH) 500 mg tablet Take one tablet by mouth every 4 hours as needed for Pain. Max of 4,000 mg of acetaminophen in 24 hours.    carvediloL (COREG) 25 mg tablet TAKE 1 AND 1/2 TABLETS TWICE DAILY WITH MEALS (Patient taking differently: Take one tablet by mouth twice daily with meals.)    cetirizine (ZYRTEC) 10 mg tablet Take one tablet by mouth daily.    fluticasone propionate (FLONASE) 50 mcg/actuation nasal spray, suspension USE 1 SPRAY(S) IN EACH NOSTRIL ONCE DAILY    hydrOXYchloroQUINE (PLAQUENIL) 200 mg tablet Take one tablet by mouth daily.    omeprazole DR (PRILOSEC) 20 mg capsule Take one capsule by mouth as Needed.    sacubitriL-valsartan (ENTRESTO) 24-26 mg tablet Take one tablet by mouth twice daily.    simvastatin (ZOCOR) 20 mg tablet Take one tablet by mouth at bedtime daily. Indications: high cholesterol

## 2023-08-05 ENCOUNTER — Encounter: Admit: 2023-08-05 | Discharge: 2023-08-05 | Payer: MEDICARE

## 2023-09-05 ENCOUNTER — Encounter: Admit: 2023-09-05 | Discharge: 2023-09-06 | Payer: MEDICARE

## 2023-09-18 ENCOUNTER — Encounter: Admit: 2023-09-18 | Discharge: 2023-09-19 | Payer: MEDICARE

## 2023-10-04 ENCOUNTER — Encounter: Admit: 2023-10-04 | Discharge: 2023-10-04 | Payer: MEDICARE

## 2023-10-04 DIAGNOSIS — Z95810 Presence of automatic (implantable) cardiac defibrillator: Secondary | ICD-10-CM

## 2023-10-04 DIAGNOSIS — I472 VT (ventricular tachycardia) (CMS-HCC): Principal | ICD-10-CM

## 2023-11-12 ENCOUNTER — Encounter: Admit: 2023-11-12 | Discharge: 2023-11-12 | Payer: MEDICARE

## 2023-11-12 NOTE — Telephone Encounter
 Called and spoke to patient. Patient states that she is doing fine and is completely asymptomatic. Discussed with Dr. Velora in clinic. No new recommendations at this time. Patient is to keep upcoming appointments in December. Patient verbalized understanding and has no further questions or concerns at this time.

## 2023-11-12 NOTE — Telephone Encounter
-----   Message from [place] B sent at 11/12/2023  8:00 AM CDT -----  Regarding: yellow alert  Yellow alert for low CRT pacing, Currenlty ~ 80% on trend, some variability on trend. VSE are suggestive of ST with rates of 133-171bpm, may consider increasing Max track rate, it is currently 130 bpm, could increase to 145 bpm.     Please see full report    [place]

## 2023-12-01 ENCOUNTER — Encounter: Admit: 2023-12-01 | Discharge: 2023-12-02 | Payer: MEDICARE

## 2024-01-21 ENCOUNTER — Ambulatory Visit: Admit: 2024-01-21 | Discharge: 2024-01-22 | Payer: MEDICARE

## 2024-01-21 ENCOUNTER — Encounter: Admit: 2024-01-21 | Discharge: 2024-01-21 | Payer: MEDICARE

## 2024-02-10 ENCOUNTER — Encounter: Admit: 2024-02-10 | Discharge: 2024-02-10 | Payer: MEDICARE

## 2024-03-03 ENCOUNTER — Encounter: Admit: 2024-03-03 | Discharge: 2024-03-04 | Payer: MEDICARE

## 2024-03-03 ENCOUNTER — Encounter: Admit: 2024-03-03 | Discharge: 2024-03-03 | Payer: MEDICARE

## 2024-03-04 ENCOUNTER — Encounter: Admit: 2024-03-04 | Discharge: 2024-03-04 | Payer: MEDICARE

## 2024-03-04 NOTE — Telephone Encounter [36]
-----   Message from [place] A sent at 03/03/2024 11:03 AM CST -----  Regarding: Dr CHRISTELLA Darter - elevated heart failure diagnostic  Heart failure diagnostics assessed through the device  Status: Elevated  Possible OptiVol fluid accumulation: 31-Dec-2023 -- ongoing    Report is sent to chart, please follow up  Thanks  [place] / Device team

## 2024-03-04 NOTE — Telephone Encounter [36]
 Called and discussed with Stephanie Morse.  She states she is doing okay.  She states she has not had any weight gain, or shortness of breath.  She does have some swelling in her left ankle, which she states is not uncommon for her.  Unique has follow up on 1/22 with Dr. Velora in [place].  Pt confirmed appt time, date and location.  Will callback with any questions, concerns or problems.    Will route to Dr. Velora for review and recommendations.

## 2024-03-12 ENCOUNTER — Encounter: Admit: 2024-03-12 | Discharge: 2024-03-12 | Payer: MEDICARE

## 2024-03-12 ENCOUNTER — Ambulatory Visit: Admit: 2024-03-12 | Discharge: 2024-03-12 | Payer: MEDICARE

## 2024-03-12 VITALS — BP 110/83 | HR 82 | Ht 65.0 in | Wt 163.8 lb

## 2024-03-12 DIAGNOSIS — M359 Systemic involvement of connective tissue, unspecified: Secondary | ICD-10-CM

## 2024-03-12 DIAGNOSIS — I4719 Atrial tachycardia: Secondary | ICD-10-CM

## 2024-03-12 DIAGNOSIS — R058 Cough due to ACE inhibitor: Secondary | ICD-10-CM

## 2024-03-12 DIAGNOSIS — I10 Essential (primary) hypertension: Secondary | ICD-10-CM

## 2024-03-12 DIAGNOSIS — I110 Hypertensive heart disease with heart failure: Secondary | ICD-10-CM

## 2024-03-12 DIAGNOSIS — I429 Cardiomyopathy, unspecified: Secondary | ICD-10-CM

## 2024-03-12 DIAGNOSIS — E7800 Pure hypercholesterolemia, unspecified: Secondary | ICD-10-CM

## 2024-03-12 DIAGNOSIS — I472 VT (ventricular tachycardia) (CMS-HCC): Principal | ICD-10-CM

## 2024-03-12 DIAGNOSIS — G500 Trigeminal neuralgia: Secondary | ICD-10-CM

## 2024-03-12 DIAGNOSIS — E041 Nontoxic single thyroid nodule: Secondary | ICD-10-CM

## 2024-03-12 DIAGNOSIS — F4389 Caregiver stress syndrome: Secondary | ICD-10-CM

## 2024-03-12 DIAGNOSIS — Z95810 Presence of automatic (implantable) cardiac defibrillator: Secondary | ICD-10-CM

## 2024-03-24 ENCOUNTER — Encounter: Admit: 2024-03-24 | Discharge: 2024-03-24 | Payer: MEDICARE

## 2024-03-24 MED ORDER — CARVEDILOL 25 MG PO TAB
25 mg | ORAL_TABLET | Freq: Two times a day (BID) | ORAL | 3 refills | 90.00000 days | Status: AC
Start: 2024-03-24 — End: ?
